# Patient Record
Sex: Female | Born: 1950 | Race: Black or African American | Hispanic: No | Marital: Single | Smoking: Current every day smoker
Health system: Southern US, Community
[De-identification: ages and names within clinical notes are randomized; demographics above are authoritative.]

## PROBLEM LIST (undated history)

## (undated) DIAGNOSIS — E119 Type 2 diabetes mellitus without complications: Secondary | ICD-10-CM

## (undated) DIAGNOSIS — I10 Essential (primary) hypertension: Secondary | ICD-10-CM

## (undated) DIAGNOSIS — K219 Gastro-esophageal reflux disease without esophagitis: Secondary | ICD-10-CM

## (undated) DIAGNOSIS — R609 Edema, unspecified: Secondary | ICD-10-CM

## (undated) DIAGNOSIS — E039 Hypothyroidism, unspecified: Secondary | ICD-10-CM

## (undated) DIAGNOSIS — R011 Cardiac murmur, unspecified: Secondary | ICD-10-CM

## (undated) HISTORY — PX: ABDOMINAL HYSTERECTOMY: SHX81

## (undated) HISTORY — PX: BIOPSY THYROID: PRO38

## (undated) HISTORY — PX: AMPUTATION: SHX166

---

## 2012-07-28 ENCOUNTER — Emergency Department: Payer: Self-pay | Admitting: Emergency Medicine

## 2012-07-28 LAB — URINALYSIS, COMPLETE
Bacteria: NONE SEEN
Bilirubin,UR: NEGATIVE
Glucose,UR: >=500 mg/dL (ref 0–75)
Ph: 6 (ref 4.5–8.0)
RBC,UR: 3 /HPF (ref 0–5)
Squamous Epithelial: 6
WBC UR: 1 /HPF (ref 0–5)

## 2016-01-27 ENCOUNTER — Encounter: Payer: Self-pay | Admitting: Emergency Medicine

## 2016-01-27 ENCOUNTER — Emergency Department: Payer: BLUE CROSS/BLUE SHIELD

## 2016-01-27 ENCOUNTER — Emergency Department
Admission: EM | Admit: 2016-01-27 | Discharge: 2016-01-27 | Disposition: A | Discharge status: Home / Self Care | Payer: BLUE CROSS/BLUE SHIELD | Attending: Emergency Medicine | Admitting: Emergency Medicine

## 2016-01-27 DIAGNOSIS — Y999 Unspecified external cause status: Secondary | ICD-10-CM | POA: Diagnosis not present

## 2016-01-27 DIAGNOSIS — W010XXA Fall on same level from slipping, tripping and stumbling without subsequent striking against object, initial encounter: Secondary | ICD-10-CM | POA: Insufficient documentation

## 2016-01-27 DIAGNOSIS — F17210 Nicotine dependence, cigarettes, uncomplicated: Secondary | ICD-10-CM | POA: Insufficient documentation

## 2016-01-27 DIAGNOSIS — Y9301 Activity, walking, marching and hiking: Secondary | ICD-10-CM | POA: Insufficient documentation

## 2016-01-27 DIAGNOSIS — Y929 Unspecified place or not applicable: Secondary | ICD-10-CM | POA: Diagnosis not present

## 2016-01-27 DIAGNOSIS — S56912A Strain of unspecified muscles, fascia and tendons at forearm level, left arm, initial encounter: Secondary | ICD-10-CM

## 2016-01-27 DIAGNOSIS — S56812A Strain of other muscles, fascia and tendons at forearm level, left arm, initial encounter: Secondary | ICD-10-CM | POA: Insufficient documentation

## 2016-01-27 DIAGNOSIS — S6992XA Unspecified injury of left wrist, hand and finger(s), initial encounter: Secondary | ICD-10-CM | POA: Diagnosis present

## 2016-01-27 MED ORDER — ACETAMINOPHEN-CODEINE #3 300-30 MG PO TABS: 1.0000 | ORAL_TABLET | ORAL | Status: DC | PRN

## 2016-01-27 NOTE — ED Provider Notes (Signed)
CSN: [id number]     Arrival date & time 01/27/16  1934 History   First MD Initiated Contact with Patient 01/27/16 2041     Chief Complaint  Patient presents with  . Fall     HPI  65 year old female who had a mechanical, non-syncopal fall earlier today. She states that she tripped over her mother's flowerpot and landed on the concrete. She states that she landed on her right forearm. She states that she has some minor abrasions to bilateral knees and her left wrist is a little sore but mainly the pain is in the right forearm from the wrist to the elbow. She took Tylenol earlier at about 4:00 with some relief. She denies striking her head or loss of consciousness. She does take a daily 81 mg aspirin.  No past medical history on file. No past surgical history on file. No family history on file. Social History  Substance Use Topics  . Smoking status: Heavy Tobacco Smoker -- 0.50 packs/day    Types: Cigarettes  . Smokeless tobacco: Not on file  . Alcohol Use: No   OB History    No data available     Review of Systems  Constitutional: Negative.   HENT: Negative.   Respiratory: Negative.   Musculoskeletal: Positive for myalgias. Negative for back pain, joint swelling, gait problem and neck pain.  Neurological: Negative for dizziness and headaches.  Psychiatric/Behavioral: Negative for confusion.      Allergies  Crestor  Home Medications   Prior to Admission medications   Medication Sig Start Date End Date Taking? Authorizing Provider  acetaminophen-codeine (TYLENOL #3) 300-30 MG tablet Take 1-2 tablets by mouth every 4 (four) hours as needed for moderate pain. 01/27/16   Mandalyn Pasqua B Jakwan Sally, FNP   BP 185/89 mmHg  Pulse 84  Temp(Src) 98.5 F (36.9 C) (Oral)  Ht 5\' 7"  (1.702 m)  Wt 72.576 kg  BMI 25.05 kg/m2  SpO2 96% Physical Exam  Constitutional: She is oriented to person, place, and time. She appears well-developed and well-nourished.  HENT:  Head: Normocephalic.  Eyes:  EOM are normal.  Neck: Normal range of motion.  Pulmonary/Chest: Effort normal.  Musculoskeletal: She exhibits tenderness.       Arms:      Legs: Neurological: She is alert and oriented to person, place, and time. No cranial nerve deficit. Coordination normal.  Skin: Skin is warm and dry.  Psychiatric: Her behavior is normal. Judgment and thought content normal.  Nursing note and vitals reviewed.   ED Course  Procedures (including critical care time) Labs Review Labs Reviewed - No data to display  Imaging Review Dg Forearm Left  01/27/2016  CLINICAL DATA:  Pain following fall EXAM: LEFT FOREARM - 2 VIEW COMPARISON:  None. FINDINGS: Frontal and lateral views were obtained. There is no fracture or dislocation. The joint spaces appear normal. There is a small spur arising from the olecranon process of the proximal ulna. No elbow joint effusion. IMPRESSION: Small proximal ulnar olecranon process spur. No fracture or dislocation. Electronically Signed   By: Lowella Grip III M.D.   On: 01/27/2016 19:53   I have personally reviewed and evaluated these images and lab results as part of my medical decision-making.   EKG Interpretation None      MDM   Final diagnoses:  Forearm strain, left, initial encounter    Left arm was placed in a sling after x-ray results were discussed with patient. She was instructed to wear the sling for  comfort and remove when she is at rest. She was encouraged to follow up with orthopedic provider on-call for symptoms that are not improving over the week. She was instructed to return to the emergency department for symptoms that change or worsen if she is unable schedule an appointment.    Victorino Dike, FNP 01/27/16 [place], MD 01/27/16 2237

## 2016-01-27 NOTE — Discharge Instructions (Signed)
Muscle Strain °A muscle strain (pulled muscle) happens when a muscle is stretched beyond normal length. It happens when a sudden, violent force stretches your muscle too far. Usually, a few of the fibers in your muscle are torn. Muscle strain is common in athletes. Recovery usually takes 1-2 weeks. Complete healing takes 5-6 weeks.  °HOME CARE  °· Follow the PRICE method of treatment to help your injury get better. Do this the first 2-3 days after the injury: °¨ Protect. Protect the muscle to keep it from getting injured again. °¨ Rest. Limit your activity and rest the injured body part. °¨ Ice. Put ice in a plastic bag. Place a towel between your skin and the bag. Then, apply the ice and leave it on from 15-20 minutes each hour. After the third day, switch to moist heat packs. °¨ Compression. Use a splint or elastic bandage on the injured area for comfort. Do not put it on too tightly. °¨ Elevate. Keep the injured body part above the level of your heart. °· Only take medicine as told by your doctor. °· Warm up before doing exercise to prevent future muscle strains. °GET HELP IF:  °· You have more pain or puffiness (swelling) in the injured area. °· You feel numbness, tingling, or notice a loss of strength in the injured area. °MAKE SURE YOU:  °· Understand these instructions. °· Will watch your condition. °· Will get help right away if you are not doing well or get worse. °  °This information is not intended to replace advice given to you by your health care provider. Make sure you discuss any questions you have with your health care provider. °  °Document Released: 07/21/2008 Document Revised: 08/02/2013 Document Reviewed: 05/11/2013 °Elsevier Interactive Patient Education ©2016 [employer] ° °

## 2016-01-27 NOTE — ED Notes (Signed)
Pt ambulatory to triage s/p fall.  Pt  major c/o of weakness and pain with movement to left elbow, pt dressed scratches to left knee and bruised to sub right knee.   Pt reports fall onto concrete from walking backwards position. Pt denies head injury or LOC and can't remember last tetanus shot.  Pt took 2 x 350 tylenol at 1600.  Pt takes daily low dose ASA.

## 2016-02-12 ENCOUNTER — Other Ambulatory Visit: Payer: Self-pay | Admitting: Internal Medicine

## 2016-02-12 DIAGNOSIS — Z1239 Encounter for other screening for malignant neoplasm of breast: Secondary | ICD-10-CM

## 2016-03-02 ENCOUNTER — Ambulatory Visit
Admission: RE | Admit: 2016-03-02 | Discharge: 2016-03-02 | Disposition: A | Discharge status: Home / Self Care | Payer: BLUE CROSS/BLUE SHIELD | Source: Ambulatory Visit | Attending: Internal Medicine | Admitting: Internal Medicine

## 2016-03-02 DIAGNOSIS — Z1231 Encounter for screening mammogram for malignant neoplasm of breast: Secondary | ICD-10-CM | POA: Diagnosis present

## 2016-03-02 DIAGNOSIS — Z1239 Encounter for other screening for malignant neoplasm of breast: Secondary | ICD-10-CM

## 2016-09-03 DIAGNOSIS — R7989 Other specified abnormal findings of blood chemistry: Secondary | ICD-10-CM | POA: Diagnosis not present

## 2016-09-03 DIAGNOSIS — E7800 Pure hypercholesterolemia, unspecified: Secondary | ICD-10-CM | POA: Diagnosis not present

## 2016-09-03 DIAGNOSIS — E119 Type 2 diabetes mellitus without complications: Secondary | ICD-10-CM | POA: Diagnosis not present

## 2016-09-03 DIAGNOSIS — I10 Essential (primary) hypertension: Secondary | ICD-10-CM | POA: Diagnosis not present

## 2016-09-07 DIAGNOSIS — K219 Gastro-esophageal reflux disease without esophagitis: Secondary | ICD-10-CM | POA: Diagnosis not present

## 2016-09-07 DIAGNOSIS — H9313 Tinnitus, bilateral: Secondary | ICD-10-CM | POA: Diagnosis not present

## 2016-09-07 DIAGNOSIS — H6063 Unspecified chronic otitis externa, bilateral: Secondary | ICD-10-CM | POA: Diagnosis not present

## 2016-09-07 DIAGNOSIS — E7800 Pure hypercholesterolemia, unspecified: Secondary | ICD-10-CM | POA: Diagnosis not present

## 2016-09-07 DIAGNOSIS — R946 Abnormal results of thyroid function studies: Secondary | ICD-10-CM | POA: Diagnosis not present

## 2016-09-07 DIAGNOSIS — I10 Essential (primary) hypertension: Secondary | ICD-10-CM | POA: Diagnosis not present

## 2016-09-07 DIAGNOSIS — E119 Type 2 diabetes mellitus without complications: Secondary | ICD-10-CM | POA: Diagnosis not present

## 2016-12-24 DIAGNOSIS — E119 Type 2 diabetes mellitus without complications: Secondary | ICD-10-CM | POA: Diagnosis not present

## 2017-01-01 DIAGNOSIS — K219 Gastro-esophageal reflux disease without esophagitis: Secondary | ICD-10-CM | POA: Diagnosis not present

## 2017-01-01 DIAGNOSIS — I10 Essential (primary) hypertension: Secondary | ICD-10-CM | POA: Diagnosis not present

## 2017-01-01 DIAGNOSIS — H6063 Unspecified chronic otitis externa, bilateral: Secondary | ICD-10-CM | POA: Diagnosis not present

## 2017-01-01 DIAGNOSIS — E119 Type 2 diabetes mellitus without complications: Secondary | ICD-10-CM | POA: Diagnosis not present

## 2017-01-01 DIAGNOSIS — H9313 Tinnitus, bilateral: Secondary | ICD-10-CM | POA: Diagnosis not present

## 2017-01-01 DIAGNOSIS — R946 Abnormal results of thyroid function studies: Secondary | ICD-10-CM | POA: Diagnosis not present

## 2017-01-01 DIAGNOSIS — E7800 Pure hypercholesterolemia, unspecified: Secondary | ICD-10-CM | POA: Diagnosis not present

## 2017-02-03 DIAGNOSIS — I10 Essential (primary) hypertension: Secondary | ICD-10-CM | POA: Diagnosis not present

## 2017-02-03 DIAGNOSIS — Z0000 Encounter for general adult medical examination without abnormal findings: Secondary | ICD-10-CM | POA: Diagnosis not present

## 2017-02-03 DIAGNOSIS — R7989 Other specified abnormal findings of blood chemistry: Secondary | ICD-10-CM | POA: Diagnosis not present

## 2017-02-03 DIAGNOSIS — E7800 Pure hypercholesterolemia, unspecified: Secondary | ICD-10-CM | POA: Diagnosis not present

## 2017-02-03 DIAGNOSIS — Z720 Tobacco use: Secondary | ICD-10-CM | POA: Diagnosis not present

## 2017-02-03 DIAGNOSIS — E119 Type 2 diabetes mellitus without complications: Secondary | ICD-10-CM | POA: Diagnosis not present

## 2017-02-03 DIAGNOSIS — K219 Gastro-esophageal reflux disease without esophagitis: Secondary | ICD-10-CM | POA: Diagnosis not present

## 2017-02-08 DIAGNOSIS — H2511 Age-related nuclear cataract, right eye: Secondary | ICD-10-CM | POA: Diagnosis not present

## 2017-02-11 DIAGNOSIS — H2511 Age-related nuclear cataract, right eye: Secondary | ICD-10-CM | POA: Diagnosis not present

## 2017-02-15 ENCOUNTER — Encounter: Payer: Self-pay | Admitting: *Deleted

## 2017-02-16 ENCOUNTER — Ambulatory Visit: Payer: Medicare HMO | Admitting: Anesthesiology

## 2017-02-16 ENCOUNTER — Encounter: Payer: Self-pay | Admitting: *Deleted

## 2017-02-16 ENCOUNTER — Ambulatory Visit
Admission: RE | Admit: 2017-02-16 | Discharge: 2017-02-16 | Disposition: A | Discharge status: Home / Self Care | Payer: Medicare HMO | Source: Ambulatory Visit | Attending: Ophthalmology | Admitting: Ophthalmology

## 2017-02-16 ENCOUNTER — Encounter
Admission: RE | Disposition: A | Discharge status: Home / Self Care | Payer: Self-pay | Source: Ambulatory Visit | Attending: Ophthalmology

## 2017-02-16 DIAGNOSIS — K219 Gastro-esophageal reflux disease without esophagitis: Secondary | ICD-10-CM | POA: Insufficient documentation

## 2017-02-16 DIAGNOSIS — E119 Type 2 diabetes mellitus without complications: Secondary | ICD-10-CM | POA: Diagnosis not present

## 2017-02-16 DIAGNOSIS — E1136 Type 2 diabetes mellitus with diabetic cataract: Secondary | ICD-10-CM | POA: Insufficient documentation

## 2017-02-16 DIAGNOSIS — Z955 Presence of coronary angioplasty implant and graft: Secondary | ICD-10-CM | POA: Insufficient documentation

## 2017-02-16 DIAGNOSIS — I10 Essential (primary) hypertension: Secondary | ICD-10-CM | POA: Insufficient documentation

## 2017-02-16 DIAGNOSIS — E039 Hypothyroidism, unspecified: Secondary | ICD-10-CM | POA: Diagnosis not present

## 2017-02-16 DIAGNOSIS — H2511 Age-related nuclear cataract, right eye: Secondary | ICD-10-CM | POA: Diagnosis not present

## 2017-02-16 DIAGNOSIS — F17200 Nicotine dependence, unspecified, uncomplicated: Secondary | ICD-10-CM | POA: Diagnosis not present

## 2017-02-16 HISTORY — DX: Gastro-esophageal reflux disease without esophagitis: K21.9

## 2017-02-16 HISTORY — DX: Type 2 diabetes mellitus without complications: E11.9

## 2017-02-16 HISTORY — PX: CATARACT EXTRACTION W/PHACO: SHX586

## 2017-02-16 HISTORY — DX: Essential (primary) hypertension: I10

## 2017-02-16 HISTORY — DX: Hypothyroidism, unspecified: E03.9

## 2017-02-16 HISTORY — DX: Cardiac murmur, unspecified: R01.1

## 2017-02-16 HISTORY — DX: Edema, unspecified: R60.9

## 2017-02-16 LAB — GLUCOSE, CAPILLARY: Glucose-Capillary: 364 mg/dL — ABNORMAL HIGH (ref 65–99)

## 2017-02-16 SURGERY — PHACOEMULSIFICATION, CATARACT, WITH IOL INSERTION
Anesthesia: Monitor Anesthesia Care | Site: Eye | Laterality: Right | Wound class: Clean

## 2017-02-16 MED ORDER — FENTANYL CITRATE (PF) 100 MCG/2ML IJ SOLN
INTRAMUSCULAR | Status: DC | PRN
  Administered 2017-02-16: 25 ug via INTRAVENOUS

## 2017-02-16 MED ORDER — LACTATED RINGERS IV SOLN
INTRAVENOUS | Status: DC | PRN
  Administered 2017-02-16: 08:00:00 via INTRAVENOUS

## 2017-02-16 MED ORDER — FENTANYL CITRATE (PF) 100 MCG/2ML IJ SOLN
INTRAMUSCULAR | Status: AC
  Filled 2017-02-16: qty 2

## 2017-02-16 MED ORDER — EPINEPHRINE PF 1 MG/ML IJ SOLN
INTRAOCULAR | Status: DC | PRN
  Administered 2017-02-16: 08:00:00 via OPHTHALMIC

## 2017-02-16 MED ORDER — LIDOCAINE HCL (PF) 2 % IJ SOLN
INTRAMUSCULAR | Status: AC
  Filled 2017-02-16: qty 2

## 2017-02-16 MED ORDER — SODIUM CHLORIDE 0.9 % IV SOLN
INTRAVENOUS | Status: DC
  Administered 2017-02-16: 07:00:00 via INTRAVENOUS

## 2017-02-16 MED ORDER — MOXIFLOXACIN HCL 0.5 % OP SOLN
OPHTHALMIC | Status: DC | PRN
  Administered 2017-02-16: 0.2 mL via OPHTHALMIC

## 2017-02-16 MED ORDER — NA CHONDROIT SULF-NA HYALURON 40-17 MG/ML IO SOLN
INTRAOCULAR | Status: AC
  Filled 2017-02-16: qty 1

## 2017-02-16 MED ORDER — MOXIFLOXACIN HCL 0.5 % OP SOLN
OPHTHALMIC | Status: AC
  Filled 2017-02-16: qty 3

## 2017-02-16 MED ORDER — POVIDONE-IODINE 5 % OP SOLN
OPHTHALMIC | Status: AC
  Filled 2017-02-16: qty 30

## 2017-02-16 MED ORDER — ARMC OPHTHALMIC DILATING DROPS
OPHTHALMIC | Status: AC
  Administered 2017-02-16: 1 via OPHTHALMIC
  Filled 2017-02-16: qty 0.4

## 2017-02-16 MED ORDER — POVIDONE-IODINE 5 % OP SOLN
OPHTHALMIC | Status: DC | PRN
  Administered 2017-02-16: 1 via OPHTHALMIC

## 2017-02-16 MED ORDER — NA CHONDROIT SULF-NA HYALURON 40-17 MG/ML IO SOLN
INTRAOCULAR | Status: DC | PRN
Start: 2017-02-16 — End: 2017-02-16
  Administered 2017-02-16: 1 mL via INTRAOCULAR

## 2017-02-16 MED ORDER — ARMC OPHTHALMIC DILATING DROPS
1.0000 "application " | OPHTHALMIC | Status: AC
  Administered 2017-02-16 (×2): 1 via OPHTHALMIC

## 2017-02-16 MED ORDER — MOXIFLOXACIN HCL 0.5 % OP SOLN: 1.0000 [drp] | OPHTHALMIC | Status: DC | PRN

## 2017-02-16 MED ORDER — MIDAZOLAM HCL 2 MG/2ML IJ SOLN
INTRAMUSCULAR | Status: AC
  Filled 2017-02-16: qty 2

## 2017-02-16 MED ORDER — EPINEPHRINE PF 1 MG/ML IJ SOLN
INTRAMUSCULAR | Status: AC
  Filled 2017-02-16: qty 2

## 2017-02-16 MED ORDER — MIDAZOLAM HCL 5 MG/5ML IJ SOLN
INTRAMUSCULAR | Status: DC | PRN
  Administered 2017-02-16: 1 mg via INTRAVENOUS

## 2017-02-16 MED ORDER — ARMC OPHTHALMIC DILATING DROPS: 1.0000 "application " | OPHTHALMIC | Status: AC

## 2017-02-16 MED ORDER — LIDOCAINE HCL (PF) 4 % IJ SOLN
INTRAOCULAR | Status: DC | PRN
  Administered 2017-02-16: 4 mL via OPHTHALMIC

## 2017-02-16 SURGICAL SUPPLY — 21 items
CANNULA ANT/CHMB 27GA (MISCELLANEOUS) ×3 IMPLANT
CUP MEDICINE 2OZ PLAST GRAD ST (MISCELLANEOUS) ×3 IMPLANT
GLOVE BIO SURGEON STRL SZ8 (GLOVE) ×3 IMPLANT
GLOVE BIOGEL M 6.5 STRL (GLOVE) ×3 IMPLANT
GLOVE SURG LX 8.0 MICRO (GLOVE) ×2
GLOVE SURG LX STRL 8.0 MICRO (GLOVE) ×1 IMPLANT
GOWN STRL REUS W/ TWL LRG LVL3 (GOWN DISPOSABLE) ×2 IMPLANT
GOWN STRL REUS W/TWL LRG LVL3 (GOWN DISPOSABLE) ×4
LENS IOL TECNIS ITEC 22.0 (Intraocular Lens) ×3 IMPLANT
PACK CATARACT (MISCELLANEOUS) ×3 IMPLANT
PACK CATARACT BRASINGTON LX (MISCELLANEOUS) ×3 IMPLANT
PACK EYE AFTER SURG (MISCELLANEOUS) ×3 IMPLANT
SOL BSS BAG (MISCELLANEOUS) ×3
SOL PREP PVP 2OZ (MISCELLANEOUS) ×3
SOLUTION BSS BAG (MISCELLANEOUS) ×1 IMPLANT
SOLUTION PREP PVP 2OZ (MISCELLANEOUS) ×1 IMPLANT
SYR 3ML LL SCALE MARK (SYRINGE) ×3 IMPLANT
SYR 5ML LL (SYRINGE) ×3 IMPLANT
SYR TB 1ML 27GX1/2 LL (SYRINGE) ×3 IMPLANT
WATER STERILE IRR 250ML POUR (IV SOLUTION) ×3 IMPLANT
WIPE NON LINTING 3.25X3.25 (MISCELLANEOUS) ×3 IMPLANT

## 2017-02-16 NOTE — Discharge Instructions (Signed)
Eye Surgery Discharge Instructions  Expect mild scratchy sensation or mild soreness. DO NOT RUB YOUR EYE!  The day of surgery:  Minimal physical activity, but bed rest is not required  No reading, computer work, or close hand work  No bending, lifting, or straining.  May watch TV  For 24 hours:  No driving, legal decisions, or alcoholic beverages  Safety precautions  Eat anything you prefer: It is better to start with liquids, then soup then solid foods.  _____ Eye patch should be worn until postoperative exam tomorrow.  ____ Solar shield eyeglasses should be worn for comfort in the sunlight/patch while sleeping  Resume all regular medications including aspirin or Coumadin if these were discontinued prior to surgery. You may shower, bathe, shave, or wash your hair. Tylenol may be taken for mild discomfort.  Call your doctor if you experience significant pain, nausea, or vomiting, fever > 101 or other signs of infection. [phone] or [phone] Specific instructions:  Follow-up Information    PORFILIO,WILLIAM [name], MD Follow up.   Specialty:  Ophthalmology Why:  02/17/17 at 8:50 Contact information: [address] [place] [address] [phone]          Eye Surgery Discharge Instructions  Expect mild scratchy sensation or mild soreness. DO NOT RUB YOUR EYE!  The day of surgery:  Minimal physical activity, but bed rest is not required  No reading, computer work, or close hand work  No bending, lifting, or straining.  May watch TV  For 24 hours:  No driving, legal decisions, or alcoholic beverages  Safety precautions  Eat anything you prefer: It is better to start with liquids, then soup then solid foods.  _____ Eye patch should be worn until postoperative exam tomorrow.  ____ Solar shield eyeglasses should be worn for comfort in the sunlight/patch while sleeping  Resume all regular medications including aspirin or Coumadin if these  were discontinued prior to surgery. You may shower, bathe, shave, or wash your hair. Tylenol may be taken for mild discomfort.  Call your doctor if you experience significant pain, nausea, or vomiting, fever > 101 or other signs of infection. [phone] or [phone] Specific instructions:  Follow-up Information    PORFILIO,WILLIAM [name], MD Follow up.   Specialty:  Ophthalmology Why:  02/17/17 at 8:50 Contact information: [place] [address] [phone]

## 2017-02-16 NOTE — H&P (Signed)
All labs reviewed. Abnormal studies sent to patients PCP when indicated.  Previous H&P reviewed, patient examined, there are NO CHANGES.  Sara Pittman [name]4/24/20187:49 AM

## 2017-02-16 NOTE — Anesthesia Postprocedure Evaluation (Signed)
Anesthesia Post Note  Patient: Sara Pittman  Procedure(s) Performed: Procedure(s) (LRB): CATARACT EXTRACTION PHACO AND INTRAOCULAR LENS PLACEMENT (IOC) (Right)  Patient location during evaluation: PACU Anesthesia Type: MAC Level of consciousness: awake and alert Pain management: pain level controlled Vital Signs Assessment: post-procedure vital signs reviewed and stable Respiratory status: spontaneous breathing, nonlabored ventilation, respiratory function stable and patient connected to nasal cannula oxygen Cardiovascular status: stable and blood pressure returned to baseline Anesthetic complications: no     Last Vitals:  Vitals:   02/16/17 0818 02/16/17 0829  BP: (!) 158/79 (!) 161/80  Pulse: 65 61  Resp: 16   Temp: (!) 35.9 C     Last Pain:  Vitals:   02/16/17 0818  TempSrc: Temporal                 Martha Clan

## 2017-02-16 NOTE — Anesthesia Post-op Follow-up Note (Cosign Needed)
Anesthesia QCDR form completed.        

## 2017-02-16 NOTE — Op Note (Signed)
PREOPERATIVE DIAGNOSIS:  Nuclear sclerotic cataract of the right eye.   POSTOPERATIVE DIAGNOSIS:  NUCLEAR SCLEROTIC CATARACT RIGHT EYE   OPERATIVE PROCEDURE: Procedure(s): CATARACT EXTRACTION PHACO AND INTRAOCULAR LENS PLACEMENT (IOC)   SURGEON:  Birder Robson, MD.   ANESTHESIA:  Anesthesiologist: Martha Clan, MD CRNA: Jennette Bill  1.      Managed anesthesia care. 2.      0.44ml of Shugarcaine was instilled in the eye following the paracentesis.   COMPLICATIONS:  None.   TECHNIQUE:   Stop and chop   DESCRIPTION OF PROCEDURE:  The patient was examined and consented in the preoperative holding area where the aforementioned topical anesthesia was applied to the right eye and then brought back to the Operating Room where the right eye was prepped and draped in the usual sterile ophthalmic fashion and a lid speculum was placed. A paracentesis was created with the side port blade and the anterior chamber was filled with viscoelastic. A near clear corneal incision was performed with the steel keratome. A continuous curvilinear capsulorrhexis was performed with a cystotome followed by the capsulorrhexis forceps. Hydrodissection and hydrodelineation were carried out with BSS on a blunt cannula. The lens was removed in a stop and chop  technique and the remaining cortical material was removed with the irrigation-aspiration handpiece. The capsular bag was inflated with viscoelastic and the Technis ZCB00  lens was placed in the capsular bag without complication. The remaining viscoelastic was removed from the eye with the irrigation-aspiration handpiece. The wounds were hydrated. The anterior chamber was flushed with Miostat and the eye was inflated to physiologic pressure. 0.30ml of Vigamox was placed in the anterior chamber. The wounds were found to be water tight. The eye was dressed with Vigamox. The patient was given protective glasses to wear throughout the day and a shield with which to sleep  tonight. The patient was also given drops with which to begin a drop regimen today and will follow-up with me in one day.  Implant Name Type Inv. Item Serial No. Manufacturer Lot No. LRB No. Used  LENS IOL DIOP 22.0 - [id number] Intraocular Lens LENS IOL DIOP 22.0 [id number]   Right 1   Procedure(s) with comments: CATARACT EXTRACTION PHACO AND INTRAOCULAR LENS PLACEMENT (IOC) (Right) - [place] 52.5 AP% 17.7 CDE 9.28 FLUID PACK LOT # [id number]  Electronically signed: [place] 02/16/2017 8:18 AM

## 2017-02-16 NOTE — Transfer of Care (Signed)
Immediate Anesthesia Transfer of Care Note  Patient: Sara Pittman  Procedure(s) Performed: Procedure(s) with comments: CATARACT EXTRACTION PHACO AND INTRAOCULAR LENS PLACEMENT (IOC) (Right) - [place] 52.5 AP% 17.7 CDE 9.28 FLUID PACK LOT # [id number]  Patient Location: PACU  Anesthesia Type:MAC  Level of Consciousness: awake, alert  and oriented  Airway & Oxygen Therapy: Patient Spontanous Breathing and Patient connected to nasal cannula oxygen  Post-op Assessment: Report given to RN and Post -op Vital signs reviewed and stable  Post vital signs: Reviewed and stable  Last Vitals:  Vitals:   02/16/17 0643 02/16/17 0818  BP: (!) 146/82 (!) 158/79  Pulse: 64 65  Resp: 16 16  Temp: (!) 35.9 C (!) 35.9 C    Last Pain:  Vitals:   02/16/17 0818  TempSrc: Temporal         Complications: No apparent anesthesia complications

## 2017-02-16 NOTE — Anesthesia Preprocedure Evaluation (Signed)
Anesthesia Evaluation  Patient identified by MRN, date of birth, ID band Patient awake    Reviewed: Allergy & Precautions, H&P , NPO status , Patient's Chart, lab work & pertinent test results, reviewed documented beta blocker date and time   History of Anesthesia Complications Negative for: history of anesthetic complications  Airway Mallampati: III  TM Distance: >3 FB Neck ROM: full    Dental  (+) Upper Dentures, Partial Lower, Poor Dentition   Pulmonary neg shortness of breath, neg sleep apnea, neg COPD, neg recent URI, Current Smoker,           Cardiovascular Exercise Tolerance: Good hypertension, (-) angina(-) CAD, (-) Past MI, (-) Cardiac Stents and (-) CABG (-) dysrhythmias + Valvular Problems/Murmurs      Neuro/Psych negative neurological ROS  negative psych ROS   GI/Hepatic Neg liver ROS, GERD  ,  Endo/Other  diabetesHypothyroidism   Renal/GU negative Renal ROS  negative genitourinary   Musculoskeletal   Abdominal   Peds  Hematology negative hematology ROS (+)   Anesthesia Other Findings Past Medical History: No date: Diabetes mellitus without complication (HCC) No date: Edema     Comment: feet/leg No date: GERD (gastroesophageal reflux disease) No date: Heart murmur No date: Hypertension No date: Hypothyroidism   Reproductive/Obstetrics negative OB ROS                             Anesthesia Physical Anesthesia Plan  ASA: III  Anesthesia Plan: MAC   Post-op Pain Management:    Induction:   Airway Management Planned:   Additional Equipment:   Intra-op Plan:   Post-operative Plan:   Informed Consent: I have reviewed the patients History and Physical, chart, labs and discussed the procedure including the risks, benefits and alternatives for the proposed anesthesia with the patient or authorized representative who has indicated his/her understanding and acceptance.    Dental Advisory Given  Plan Discussed with: Anesthesiologist, CRNA and Surgeon  Anesthesia Plan Comments:         Anesthesia Quick Evaluation

## 2017-03-02 DIAGNOSIS — H2512 Age-related nuclear cataract, left eye: Secondary | ICD-10-CM | POA: Diagnosis not present

## 2017-03-03 ENCOUNTER — Encounter: Payer: Self-pay | Admitting: *Deleted

## 2017-03-09 ENCOUNTER — Encounter
Admission: RE | Disposition: A | Discharge status: Home / Self Care | Payer: Self-pay | Source: Ambulatory Visit | Attending: Ophthalmology

## 2017-03-09 ENCOUNTER — Ambulatory Visit: Payer: Medicare HMO | Admitting: Certified Registered Nurse Anesthetist

## 2017-03-09 ENCOUNTER — Encounter: Payer: Self-pay | Admitting: *Deleted

## 2017-03-09 ENCOUNTER — Ambulatory Visit
Admission: RE | Admit: 2017-03-09 | Discharge: 2017-03-09 | Disposition: A | Discharge status: Home / Self Care | Payer: Medicare HMO | Source: Ambulatory Visit | Attending: Ophthalmology | Admitting: Ophthalmology

## 2017-03-09 DIAGNOSIS — K219 Gastro-esophageal reflux disease without esophagitis: Secondary | ICD-10-CM | POA: Insufficient documentation

## 2017-03-09 DIAGNOSIS — Z955 Presence of coronary angioplasty implant and graft: Secondary | ICD-10-CM | POA: Diagnosis not present

## 2017-03-09 DIAGNOSIS — I10 Essential (primary) hypertension: Secondary | ICD-10-CM | POA: Diagnosis not present

## 2017-03-09 DIAGNOSIS — E1136 Type 2 diabetes mellitus with diabetic cataract: Secondary | ICD-10-CM | POA: Diagnosis not present

## 2017-03-09 DIAGNOSIS — E119 Type 2 diabetes mellitus without complications: Secondary | ICD-10-CM | POA: Diagnosis not present

## 2017-03-09 DIAGNOSIS — F17200 Nicotine dependence, unspecified, uncomplicated: Secondary | ICD-10-CM | POA: Insufficient documentation

## 2017-03-09 DIAGNOSIS — E039 Hypothyroidism, unspecified: Secondary | ICD-10-CM | POA: Diagnosis not present

## 2017-03-09 DIAGNOSIS — H2512 Age-related nuclear cataract, left eye: Secondary | ICD-10-CM | POA: Diagnosis not present

## 2017-03-09 HISTORY — PX: CATARACT EXTRACTION W/PHACO: SHX586

## 2017-03-09 LAB — GLUCOSE, CAPILLARY: Glucose-Capillary: 224 mg/dL — ABNORMAL HIGH (ref 65–99)

## 2017-03-09 SURGERY — PHACOEMULSIFICATION, CATARACT, WITH IOL INSERTION
Anesthesia: Monitor Anesthesia Care | Site: Eye | Laterality: Left | Wound class: Clean

## 2017-03-09 MED ORDER — POVIDONE-IODINE 5 % OP SOLN
OPHTHALMIC | Status: DC | PRN
Start: 2017-03-09 — End: 2017-03-09
  Administered 2017-03-09: 1 via OPHTHALMIC

## 2017-03-09 MED ORDER — EPINEPHRINE PF 1 MG/ML IJ SOLN
INTRAMUSCULAR | Status: AC
  Filled 2017-03-09: qty 2

## 2017-03-09 MED ORDER — FENTANYL CITRATE (PF) 100 MCG/2ML IJ SOLN
INTRAMUSCULAR | Status: AC
  Filled 2017-03-09: qty 2

## 2017-03-09 MED ORDER — SODIUM CHLORIDE 0.9 % IV SOLN
INTRAVENOUS | Status: DC
  Administered 2017-03-09: 09:00:00 via INTRAVENOUS

## 2017-03-09 MED ORDER — NA CHONDROIT SULF-NA HYALURON 40-17 MG/ML IO SOLN
INTRAOCULAR | Status: DC | PRN
  Administered 2017-03-09: 1 mL via INTRAOCULAR

## 2017-03-09 MED ORDER — MOXIFLOXACIN HCL 0.5 % OP SOLN
OPHTHALMIC | Status: AC
  Filled 2017-03-09: qty 3

## 2017-03-09 MED ORDER — EPINEPHRINE PF 1 MG/ML IJ SOLN
INTRAMUSCULAR | Status: DC | PRN
  Administered 2017-03-09: 10:00:00 via OPHTHALMIC

## 2017-03-09 MED ORDER — NA CHONDROIT SULF-NA HYALURON 40-17 MG/ML IO SOLN
INTRAOCULAR | Status: AC
  Filled 2017-03-09: qty 1

## 2017-03-09 MED ORDER — POVIDONE-IODINE 5 % OP SOLN
OPHTHALMIC | Status: AC
  Filled 2017-03-09: qty 30

## 2017-03-09 MED ORDER — LIDOCAINE HCL (PF) 4 % IJ SOLN
INTRAOCULAR | Status: DC | PRN
  Administered 2017-03-09: 4 mL via OPHTHALMIC

## 2017-03-09 MED ORDER — MOXIFLOXACIN HCL 0.5 % OP SOLN: 1.0000 [drp] | OPHTHALMIC | Status: DC | PRN

## 2017-03-09 MED ORDER — FENTANYL CITRATE (PF) 100 MCG/2ML IJ SOLN
INTRAMUSCULAR | Status: DC | PRN
  Administered 2017-03-09: 25 ug via INTRAVENOUS
  Administered 2017-03-09: 75 ug via INTRAVENOUS

## 2017-03-09 MED ORDER — LIDOCAINE HCL (PF) 2 % IJ SOLN
INTRAMUSCULAR | Status: AC
  Filled 2017-03-09: qty 2

## 2017-03-09 MED ORDER — CARBACHOL 0.01 % IO SOLN
INTRAOCULAR | Status: DC | PRN
  Administered 2017-03-09: 0.5 mL via INTRAOCULAR

## 2017-03-09 MED ORDER — MOXIFLOXACIN HCL 0.5 % OP SOLN
OPHTHALMIC | Status: DC | PRN
  Administered 2017-03-09: 0.2 mL via OPHTHALMIC

## 2017-03-09 MED ORDER — ARMC OPHTHALMIC DILATING DROPS
OPHTHALMIC | Status: AC
  Filled 2017-03-09: qty 0.4

## 2017-03-09 MED ORDER — ARMC OPHTHALMIC DILATING DROPS
1.0000 "application " | OPHTHALMIC | Status: AC
  Administered 2017-03-09 (×3): 1 via OPHTHALMIC

## 2017-03-09 SURGICAL SUPPLY — 14 items
GLOVE BIO SURGEON STRL SZ8 (GLOVE) ×3 IMPLANT
GLOVE BIOGEL M 6.5 STRL (GLOVE) ×3 IMPLANT
GLOVE SURG LX 8.0 MICRO (GLOVE) ×2
GLOVE SURG LX STRL 8.0 MICRO (GLOVE) ×1 IMPLANT
GOWN STRL REUS W/ TWL LRG LVL3 (GOWN DISPOSABLE) ×2 IMPLANT
GOWN STRL REUS W/TWL LRG LVL3 (GOWN DISPOSABLE) ×4
LENS IOL TECNIS ITEC 22.0 (Intraocular Lens) ×3 IMPLANT
PACK CATARACT (MISCELLANEOUS) ×3 IMPLANT
PACK CATARACT BRASINGTON LX (MISCELLANEOUS) ×3 IMPLANT
SOL BSS BAG (MISCELLANEOUS) ×3
SOLUTION BSS BAG (MISCELLANEOUS) ×1 IMPLANT
SYR 5ML LL (SYRINGE) ×3 IMPLANT
WATER STERILE IRR 250ML POUR (IV SOLUTION) ×3 IMPLANT
WIPE NON LINTING 3.25X3.25 (MISCELLANEOUS) ×3 IMPLANT

## 2017-03-09 NOTE — Op Note (Signed)
PREOPERATIVE DIAGNOSIS:  Nuclear sclerotic cataract of the left eye.   POSTOPERATIVE DIAGNOSIS:  Nuclear sclerotic cataract of the left eye.   OPERATIVE PROCEDURE: Procedure(s): CATARACT EXTRACTION PHACO AND INTRAOCULAR LENS PLACEMENT (IOC)   SURGEON:  Birder Robson, MD.   ANESTHESIA:  Anesthesiologist: Martha Clan, MD CRNA: Demetrius Charity, CRNA  1.      Managed anesthesia care. 2.     0.42ml of Shugarcaine was instilled following the paracentesis   COMPLICATIONS:  None.   TECHNIQUE:   Stop and chop   DESCRIPTION OF PROCEDURE:  The patient was examined and consented in the preoperative holding area where the aforementioned topical anesthesia was applied to the left eye and then brought back to the Operating Room where the left eye was prepped and draped in the usual sterile ophthalmic fashion and a lid speculum was placed. A paracentesis was created with the side port blade and the anterior chamber was filled with viscoelastic. A near clear corneal incision was performed with the steel keratome. A continuous curvilinear capsulorrhexis was performed with a cystotome followed by the capsulorrhexis forceps. Hydrodissection and hydrodelineation were carried out with BSS on a blunt cannula. The lens was removed in a stop and chop  technique and the remaining cortical material was removed with the irrigation-aspiration handpiece. The capsular bag was inflated with viscoelastic and the Technis ZCB00 lens was placed in the capsular bag without complication. The remaining viscoelastic was removed from the eye with the irrigation-aspiration handpiece. The wounds were hydrated. The anterior chamber was flushed with Miostat and the eye was inflated to physiologic pressure. 0.79ml Vigamox was placed in the anterior chamber. The wounds were found to be water tight. The eye was dressed with Vigamox. The patient was given protective glasses to wear throughout the day and a shield with which to sleep  tonight. The patient was also given drops with which to begin a drop regimen today and will follow-up with me in one day.  Implant Name Type Inv. Item Serial No. Manufacturer Lot No. LRB No. Used  LENS IOL DIOP 22.0 - [id number] Intraocular Lens LENS IOL DIOP 22.0 [id number] [id number] AMO   Left 1    Procedure(s) with comments: CATARACT EXTRACTION PHACO AND INTRAOCULAR LENS PLACEMENT (IOC) (Left) - [place] 1:02.2 AP% 19.8 CDE 12.30 Fluid Pack Lot # [id number]  Electronically signed: [place] 03/09/2017 9:59 AM

## 2017-03-09 NOTE — Discharge Instructions (Signed)
Eye Surgery Discharge Instructions  Expect mild scratchy sensation or mild soreness. DO NOT RUB YOUR EYE!  The day of surgery:  Minimal physical activity, but bed rest is not required  No reading, computer work, or close hand work  No bending, lifting, or straining.  May watch TV  For 24 hours:  No driving, legal decisions, or alcoholic beverages  Safety precautions  Eat anything you prefer: It is better to start with liquids, then soup then solid foods.  _____ Eye patch should be worn until postoperative exam tomorrow.  ____ Solar shield eyeglasses should be worn for comfort in the sunlight/patch while sleeping  Resume all regular medications including aspirin or Coumadin if these were discontinued prior to surgery. You may shower, bathe, shave, or wash your hair. Tylenol may be taken for mild discomfort.  Call your doctor if you experience significant pain, nausea, or vomiting, fever > 101 or other signs of infection. [phone] or [phone] Specific instructions:  Follow-up Information    Birder Robson, MD Follow up.   Specialty:  Ophthalmology Why:  May 16 at 10:55am Contact information: [address] [place] [address] [phone]

## 2017-03-09 NOTE — Anesthesia Post-op Follow-up Note (Cosign Needed)
Anesthesia QCDR form completed.        

## 2017-03-09 NOTE — Anesthesia Procedure Notes (Signed)
Procedure Name: MAC Performed by: Meighan Treto Pre-anesthesia Checklist: Patient identified, Emergency Drugs available, Suction available, Patient being monitored and Timeout performed Oxygen Delivery Method: Nasal cannula       

## 2017-03-09 NOTE — Anesthesia Preprocedure Evaluation (Signed)
Anesthesia Evaluation  Patient identified by MRN, date of birth, ID band Patient awake    Reviewed: Allergy & Precautions, H&P , NPO status , Patient's Chart, lab work & pertinent test results, reviewed documented beta blocker date and time   History of Anesthesia Complications Negative for: history of anesthetic complications  Airway Mallampati: III  TM Distance: >3 FB Neck ROM: full    Dental  (+) Upper Dentures, Partial Lower, Poor Dentition   Pulmonary neg shortness of breath, neg sleep apnea, neg COPD, neg recent URI, Current Smoker,           Cardiovascular Exercise Tolerance: Good hypertension, (-) angina(-) CAD, (-) Past MI, (-) Cardiac Stents and (-) CABG (-) dysrhythmias + Valvular Problems/Murmurs      Neuro/Psych negative neurological ROS  negative psych ROS   GI/Hepatic Neg liver ROS, GERD  ,  Endo/Other  diabetesHypothyroidism   Renal/GU negative Renal ROS  negative genitourinary   Musculoskeletal   Abdominal   Peds  Hematology negative hematology ROS (+)   Anesthesia Other Findings Past Medical History: No date: Diabetes mellitus without complication (HCC) No date: Edema     Comment: feet/leg No date: GERD (gastroesophageal reflux disease) No date: Heart murmur No date: Hypertension No date: Hypothyroidism   Reproductive/Obstetrics negative OB ROS                             Anesthesia Physical  Anesthesia Plan  ASA: III  Anesthesia Plan: MAC   Post-op Pain Management:    Induction:   Airway Management Planned:   Additional Equipment:   Intra-op Plan:   Post-operative Plan:   Informed Consent: I have reviewed the patients History and Physical, chart, labs and discussed the procedure including the risks, benefits and alternatives for the proposed anesthesia with the patient or authorized representative who has indicated his/her understanding and acceptance.    Dental Advisory Given  Plan Discussed with: Anesthesiologist, CRNA and Surgeon  Anesthesia Plan Comments:         Anesthesia Quick Evaluation

## 2017-03-09 NOTE — Anesthesia Postprocedure Evaluation (Signed)
Anesthesia Post Note  Patient: Sara Pittman  Procedure(s) Performed: Procedure(s) (LRB): CATARACT EXTRACTION PHACO AND INTRAOCULAR LENS PLACEMENT (IOC) (Left)  Patient location during evaluation: PACU Anesthesia Type: MAC Level of consciousness: awake and alert and oriented Pain management: satisfactory to patient Vital Signs Assessment: post-procedure vital signs reviewed and stable Respiratory status: respiratory function stable Cardiovascular status: blood pressure returned to baseline Anesthetic complications: no     Last Vitals:  Vitals:   03/09/17 0815  BP: (!) 179/81  Pulse: 68  Resp: 18  Temp: 36.8 C    Last Pain:  Vitals:   03/09/17 0815  TempSrc: Oral                 Blima Singer

## 2017-03-09 NOTE — H&P (Signed)
All labs reviewed. Abnormal studies sent to patients PCP when indicated.  Previous H&P reviewed, patient examined, there are NO CHANGES.  Sara Pittman [name]5/15/20189:32 AM

## 2017-03-09 NOTE — Transfer of Care (Signed)
Immediate Anesthesia Transfer of Care Note  Patient: Sara Pittman  Procedure(s) Performed: Procedure(s) with comments: CATARACT EXTRACTION PHACO AND INTRAOCULAR LENS PLACEMENT ([place]) (Left) - [place] 1:02.2 AP% 19.8 CDE 12.30 Fluid Pack Lot # [id number]  Patient Location: PACU  Anesthesia Type:MAC  Level of Consciousness: awake, alert  and oriented  Airway & Oxygen Therapy: Patient Spontanous Breathing  Post-op Assessment: Report given to RN and Post -op Vital signs reviewed and stable  Post vital signs: Reviewed and stable  Last Vitals:  Vitals:   03/09/17 0815  BP: (!) 179/81  Pulse: 68  Resp: 18  Temp: 36.8 C    Last Pain:  Vitals:   03/09/17 0815  TempSrc: Oral         Complications: No apparent anesthesia complications

## 2017-04-17 DIAGNOSIS — J019 Acute sinusitis, unspecified: Secondary | ICD-10-CM | POA: Diagnosis not present

## 2017-04-19 DIAGNOSIS — E113393 Type 2 diabetes mellitus with moderate nonproliferative diabetic retinopathy without macular edema, bilateral: Secondary | ICD-10-CM | POA: Diagnosis not present

## 2017-10-15 ENCOUNTER — Ambulatory Visit (INDEPENDENT_AMBULATORY_CARE_PROVIDER_SITE_OTHER): Payer: Medicare Other | Admitting: Urology

## 2017-10-15 ENCOUNTER — Encounter: Payer: Self-pay | Admitting: Urology

## 2017-10-15 VITALS — BP 158/77 | HR 82 | Ht 67.5 in | Wt 160.4 lb

## 2017-10-15 DIAGNOSIS — R3129 Other microscopic hematuria: Secondary | ICD-10-CM

## 2017-10-15 LAB — URINALYSIS, COMPLETE
Bilirubin, UA: NEGATIVE
Glucose, UA: NEGATIVE
Ketones, UA: NEGATIVE
LEUKOCYTES UA: NEGATIVE
NITRITE UA: NEGATIVE
PH UA: 5.5 (ref 5.0–7.5)
Specific Gravity, UA: <1.005 — ABNORMAL LOW (ref 1.005–1.030)
UUROB: 0.2 mg/dL (ref 0.2–1.0)

## 2017-10-15 NOTE — Progress Notes (Signed)
10/15/2017 2:39 PM   Sara Pittman 1951-06-28 [id number]  Referring provider: Katheren Shams [address] [place], [place] [address]  Chief Complaint  Patient presents with  . Hematuria    HPI: The patient is a 66 year old female who presents today for evaluation of microscopic hematuria this was seen on urinalysis her primary care providers were 4-10 red blood cells per high field were encountered.  Patient notes no history of gross hematuria.  No recent urinary tract infection.  No difficulties with urination.  No history of nephrolithiasis.  No current complaints.  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   PMH: Past Medical History:  Diagnosis Date  . Diabetes mellitus without complication ([place])   . Edema    feet/leg  . GERD (gastroesophageal reflux disease)   . Heart murmur   . Hypertension   . Hypothyroidism     Surgical History: Past Surgical History:  Procedure Laterality Date  . ABDOMINAL HYSTERECTOMY    . AMPUTATION     tip of finger  . BIOPSY THYROID    . CATARACT EXTRACTION W/PHACO Right 02/16/2017   Procedure: CATARACT EXTRACTION PHACO AND INTRAOCULAR LENS PLACEMENT (IOC);  Surgeon: Birder Robson, MD;  Location: [facility] OR[facility];  Service: Ophthalmology;  Laterality: Right;  [place] 52.5 AP% 17.7 CDE 9.28 FLUID PACK LOT # [id number]  . CATARACT EXTRACTION W/PHACO Left 03/09/2017   Procedure: CATARACT EXTRACTION PHACO AND INTRAOCULAR LENS PLACEMENT (IOC);  Surgeon: Birder Robson, MD;  Location: [facility] OR[facility];  Service: Ophthalmology;  Laterality: Left;  [place] 1:02.2 AP% 19.8 CDE 12.30 Fluid Pack Lot # [id number]    Home Medications:  Allergies as of 10/15/2017      Reactions   Crestor [rosuvastatin] Other (See Comments)   All over aches      Medication List        Accurate as of 10/15/17  2:39 PM. Always use your most recent med list.          aspirin EC 81 MG  tablet Take 81 mg by mouth daily.   BC HEADACHE POWDER PO Take 1 packet by mouth 3 (three) times daily as needed (headache).   cholecalciferol 1000 units tablet Commonly known as:  VITAMIN D Take 1,000 Units by mouth daily.   glipiZIDE 10 MG tablet Commonly known as:  GLUCOTROL Take 10 mg by mouth 2 (two) times daily before a meal.   losartan 100 MG tablet Commonly known as:  COZAAR Take 100 mg by mouth daily.   metFORMIN 1000 MG tablet Commonly known as:  GLUCOPHAGE Take 1,000 mg by mouth 2 (two) times daily with a meal.   moxifloxacin 0.5 % ophthalmic solution Commonly known as:  VIGAMOX Apply 1 drop to eye 4 (four) times daily.   neomycin-polymyxin-hydrocortisone 3.5-10000-1 OTIC suspension Commonly known as:  CORTISPORIN Place 3 drops into both ears 4 (four) times daily as needed (ringing in ears).   omeprazole 20 MG capsule Commonly known as:  PRILOSEC Take 20 mg by mouth daily.   simvastatin 80 MG tablet Commonly known as:  ZOCOR Take 80 mg by mouth at bedtime.   TRULICITY 1.02 HE/5.2DP Sopn Generic drug:  Dulaglutide Inject 0.75 mg into the skin every 7 (seven) days. sunday       Allergies:  Allergies  Allergen Reactions  . Crestor [Rosuvastatin] Other (See Comments)    All over aches     Family History:  Family History  Problem Relation Age of Onset  . Bladder Cancer Neg Hx   . Kidney cancer Neg Hx   . Prostate cancer Neg Hx     Social History:  reports that she has been smoking cigarettes.  She has been smoking about 0.50 packs per day. she has never used smokeless tobacco. She reports that she does not drink alcohol or use drugs.  ROS: UROLOGY Frequent Urination?: No Hard to postpone urination?: No Burning/pain with urination?: No Get up at night to urinate?: Yes Leakage of urine?: Yes Urine stream starts and stops?: No Trouble starting stream?: No Do you have to strain to urinate?: No Blood in urine?: No Urinary tract infection?:  No Sexually transmitted disease?: No Injury to kidneys or bladder?: No Painful intercourse?: No Weak stream?: No Currently pregnant?: No Vaginal bleeding?: No Last menstrual period?: n  Gastrointestinal Nausea?: Yes Vomiting?: No Indigestion/heartburn?: Yes Diarrhea?: No Constipation?: No  Constitutional Fever: No Night sweats?: Yes Weight loss?: No Fatigue?: No  Skin Skin rash/lesions?: No Itching?: No  Eyes Blurred vision?: No Double vision?: No  Ears/Nose/Throat Sore throat?: No Sinus problems?: No  Hematologic/Lymphatic Swollen glands?: No Easy bruising?: No  Cardiovascular Leg swelling?: No Chest pain?: No  Respiratory Cough?: No Shortness of breath?: No  Endocrine Excessive thirst?: No  Musculoskeletal Back pain?: Yes Joint pain?: Yes  Neurological Headaches?: Yes Dizziness?: No  Psychologic Depression?: No Anxiety?: No  Physical Exam: BP (!) 158/77 (BP Location: Right Arm, Patient Position: Sitting, Cuff Size: Normal)   Pulse 82   Ht 5' 7.5" (1.715 m)   Wt 160 lb 6.4 oz (72.8 kg)   BMI 24.75 kg/m   Constitutional:  Alert and oriented, No acute distress. HEENT: [place] AT, moist mucus membranes.  Trachea midline, no masses. Cardiovascular: No clubbing, cyanosis, or edema. Respiratory: Normal respiratory effort, no increased work of breathing. GI: Abdomen is soft, nontender, nondistended, no abdominal masses GU: No CVA tenderness.  Skin: No rashes, bruises or suspicious lesions. Lymph: No cervical or inguinal adenopathy. Neurologic: Grossly intact, no focal deficits, moving all 4 extremities. Psychiatric: Normal mood and affect.  Laboratory Data: No results found for: WBC, HGB, HCT, MCV, PLT  No results found for: CREATININE  No results found for: PSA  No results found for: TESTOSTERONE  No results found for: HGBA1C  Urinalysis    Component Value Date/Time   COLORURINE Yellow 07/28/2012 1357   APPEARANCEUR Hazy 07/28/2012  1357   LABSPEC 1.027 07/28/2012 1357   PHURINE 6.0 07/28/2012 1357   GLUCOSEU >=500 07/28/2012 1357   HGBUR 1+ 07/28/2012 1357   BILIRUBINUR Negative 07/28/2012 1357   KETONESUR 1+ 07/28/2012 1357   PROTEINUR 30 mg/dL 07/28/2012 1357   NITRITE Negative 07/28/2012 1357   LEUKOCYTESUR 2+ 07/28/2012 1357     Assessment & Plan:    1.  Microscopic hematuria Follow-up for cystoscopy after CT urogram  Return for for cysto after CT.  Nickie Retort, MD  [facility] [address], [place], [place] [address] [phone]

## 2017-11-03 ENCOUNTER — Ambulatory Visit: Admission: RE | Admit: 2017-11-03 | Payer: Medicare Other | Source: Ambulatory Visit

## 2017-11-03 ENCOUNTER — Ambulatory Visit
Admission: RE | Admit: 2017-11-03 | Discharge: 2017-11-03 | Disposition: A | Discharge status: Home / Self Care | Payer: Medicare Other | Source: Ambulatory Visit | Attending: Urology | Admitting: Urology

## 2017-11-03 DIAGNOSIS — M5137 Other intervertebral disc degeneration, lumbosacral region: Secondary | ICD-10-CM | POA: Diagnosis not present

## 2017-11-03 DIAGNOSIS — R3129 Other microscopic hematuria: Secondary | ICD-10-CM | POA: Insufficient documentation

## 2017-11-03 DIAGNOSIS — I700 Atherosclerosis of aorta: Secondary | ICD-10-CM | POA: Diagnosis not present

## 2017-11-03 DIAGNOSIS — R911 Solitary pulmonary nodule: Secondary | ICD-10-CM | POA: Diagnosis not present

## 2017-11-03 DIAGNOSIS — N200 Calculus of kidney: Secondary | ICD-10-CM | POA: Diagnosis not present

## 2017-11-03 MED ORDER — IOPAMIDOL (ISOVUE-300) INJECTION 61%
125.0000 mL | Freq: Once | INTRAVENOUS | Status: AC | PRN
  Administered 2017-11-03: 125 mL via INTRAVENOUS

## 2017-11-11 ENCOUNTER — Ambulatory Visit (INDEPENDENT_AMBULATORY_CARE_PROVIDER_SITE_OTHER): Payer: Medicare Other | Admitting: Urology

## 2017-11-11 VITALS — BP 171/90 | HR 90 | Ht 67.5 in | Wt 169.7 lb

## 2017-11-11 DIAGNOSIS — R3129 Other microscopic hematuria: Secondary | ICD-10-CM

## 2017-11-11 LAB — URINALYSIS, COMPLETE
BILIRUBIN UA: NEGATIVE
GLUCOSE, UA: NEGATIVE
KETONES UA: NEGATIVE
NITRITE UA: NEGATIVE
Protein, UA: NEGATIVE
UUROB: 0.2 mg/dL (ref 0.2–1.0)
pH, UA: 6 (ref 5.0–7.5)

## 2017-11-11 LAB — MICROSCOPIC EXAMINATION
Bacteria, UA: NONE SEEN
WBC UA: NONE SEEN /HPF (ref 0–?)

## 2017-11-11 MED ORDER — CIPROFLOXACIN HCL 500 MG PO TABS
500.0000 mg | ORAL_TABLET | Freq: Once | ORAL | Status: AC
  Administered 2017-11-11: 500 mg via ORAL

## 2017-11-11 MED ORDER — LIDOCAINE HCL 2 % EX GEL
1.0000 "application " | Freq: Once | CUTANEOUS | Status: AC
  Administered 2017-11-11: 1 via URETHRAL

## 2017-11-11 NOTE — Progress Notes (Signed)
   11/11/17  CC:  Chief Complaint  Patient presents with  . Cysto    HPI: The patient is a 67 year old female who presents today for evaluation of microscopic hematuria this was seen on urinalysis her primary care providers were 4-10 red blood cells per high field were encountered.  Patient notes no history of gross hematuria.  No recent urinary tract infection.  No difficulties with urination.  No history of nephrolithiasis.  No current complaints.  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  CT hematuria was negative except for a punctate nonobstructive left mid renal calculus which is not clinically significant per.  She did have a 4 mm right middle lobe pulmonary nodule that may need further follow-up.  There were no vitals taken for this visit. NED. A&Ox3.   No respiratory distress   Abd soft, NT, ND Normal external genitalia with patent urethral meatus  Cystoscopy Procedure Note  Patient identification was confirmed, informed consent was obtained, and patient was prepped using Betadine solution.  Lidocaine jelly was administered per urethral meatus.    Preoperative abx where received prior to procedure.    Procedure: - Flexible cystoscope introduced, without any difficulty.   - Thorough search of the bladder revealed:    normal urethral meatus    normal urothelium    no stones    no ulcers     no tumors    no urethral polyps    no trabeculation  - Ureteral orifices were normal in position and appearance.  Post-Procedure: - Patient tolerated the procedure well  Assessment/ Plan:  1. Microscopic hematuria Negative workup.  Punctate left renal stone is not clinically significant.  Follow-up in 1 year for repeat urinalysis.  2.  4 mm right middle lobe pulmonary nodule Discussed the patient that depending on her risk stratification that she may need a noncontrast chest CT in 12 months.  I have  given her a copy of this report.  She will discuss this with her primary care provider and decide if this is warranted for her in the future.  She voiced understanding.  Nickie Retort, MD

## 2017-12-03 DIAGNOSIS — D3131 Benign neoplasm of right choroid: Secondary | ICD-10-CM | POA: Insufficient documentation

## 2018-01-01 IMAGING — CR DG FOREARM 2V*L*
2 series · 2 of 2 positions shown · non-contrast
Comparison: None.

CLINICAL DATA: Pain following fall

EXAM:
LEFT FOREARM - 2 VIEW

[forearm ap]
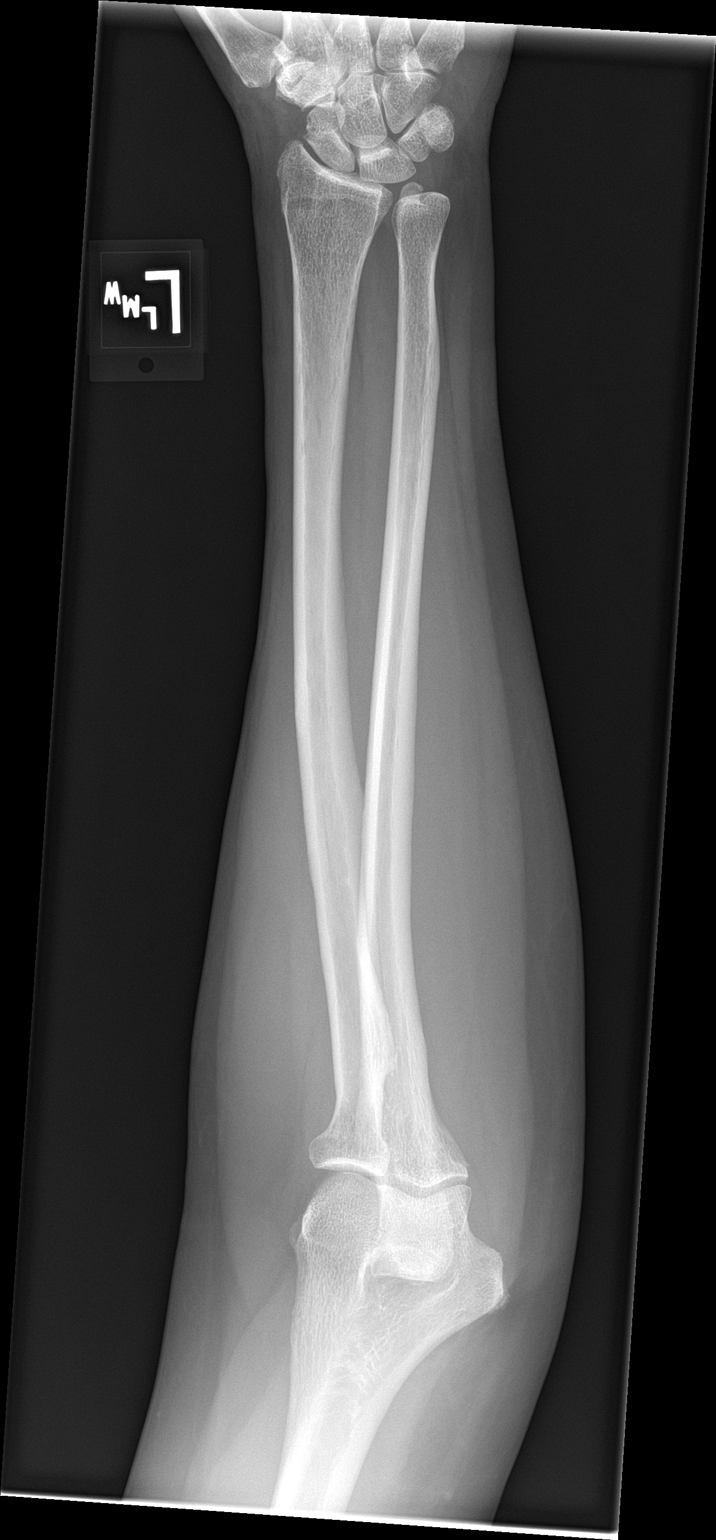

[forearm lat]
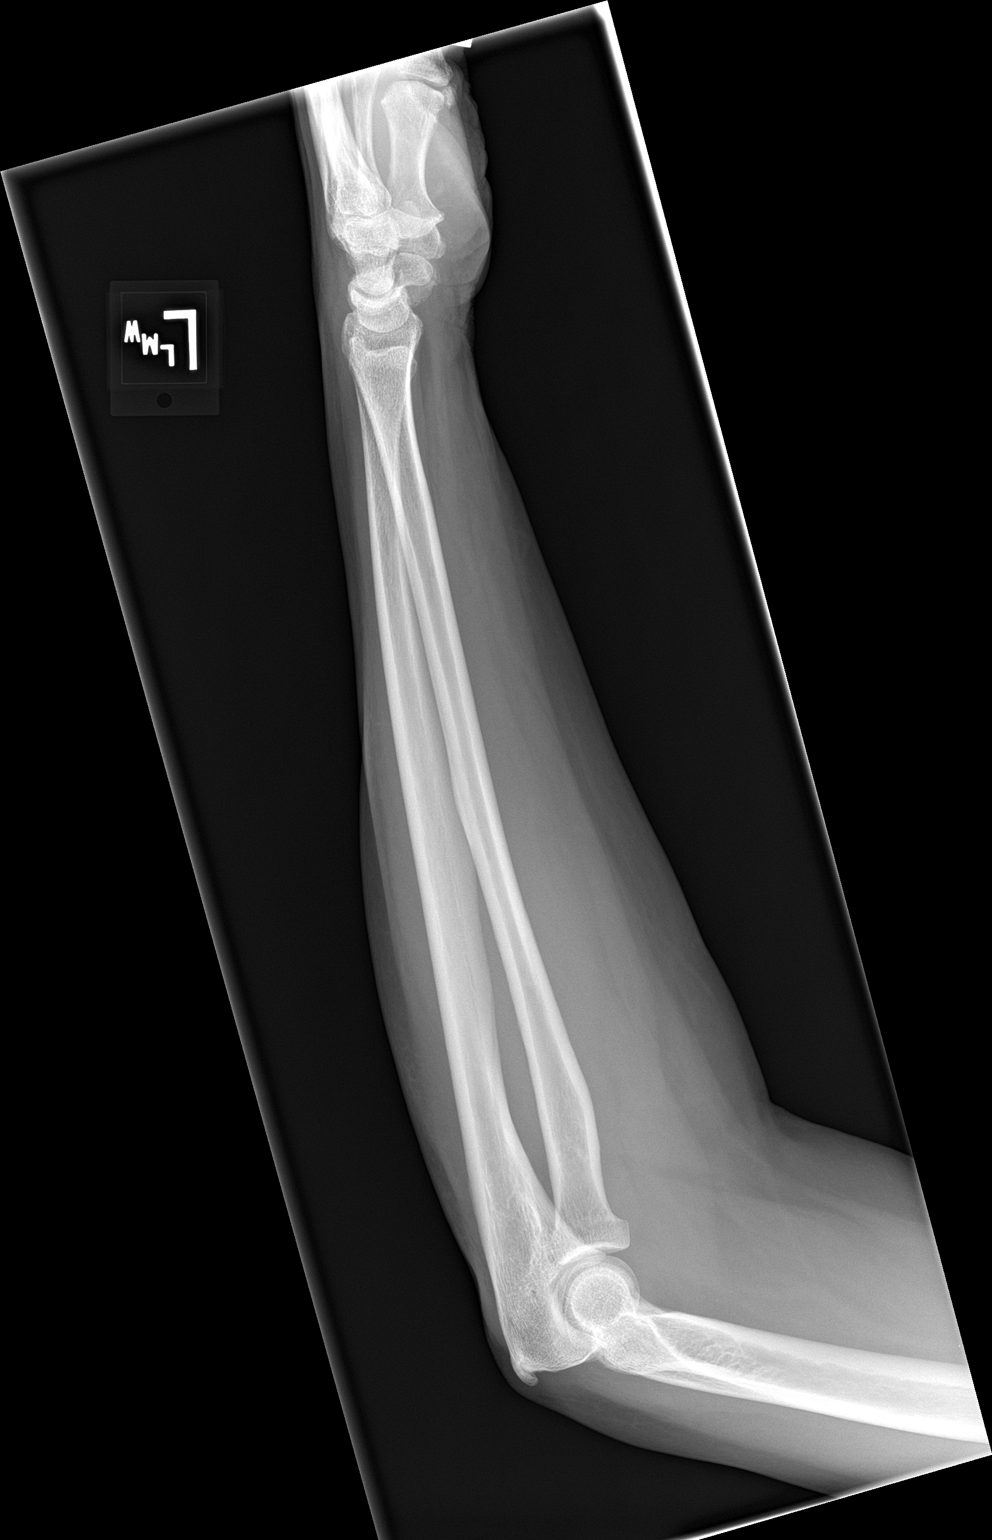

[2 of 2 positions shown; findings below may reference images not displayed]

FINDINGS: Frontal and lateral views were obtained. There is no fracture or
dislocation. The joint spaces appear normal. There is a small spur
arising from the olecranon process of the proximal ulna. No elbow
joint effusion.
IMPRESSION: Small proximal ulnar olecranon process spur. No fracture or
dislocation.

## 2018-07-06 ENCOUNTER — Encounter: Payer: Self-pay | Admitting: Emergency Medicine

## 2018-07-06 ENCOUNTER — Emergency Department
Admission: EM | Admit: 2018-07-06 | Discharge: 2018-07-06 | Disposition: A | Discharge status: Home / Self Care | Payer: Medicare Other | Attending: Student in an Organized Health Care Education/Training Program | Admitting: Student in an Organized Health Care Education/Training Program

## 2018-07-06 DIAGNOSIS — Z7984 Long term (current) use of oral hypoglycemic drugs: Secondary | ICD-10-CM | POA: Diagnosis not present

## 2018-07-06 DIAGNOSIS — F17210 Nicotine dependence, cigarettes, uncomplicated: Secondary | ICD-10-CM | POA: Diagnosis not present

## 2018-07-06 DIAGNOSIS — Z7982 Long term (current) use of aspirin: Secondary | ICD-10-CM | POA: Diagnosis not present

## 2018-07-06 DIAGNOSIS — Z79899 Other long term (current) drug therapy: Secondary | ICD-10-CM | POA: Insufficient documentation

## 2018-07-06 DIAGNOSIS — S199XXA Unspecified injury of neck, initial encounter: Secondary | ICD-10-CM | POA: Diagnosis present

## 2018-07-06 DIAGNOSIS — Y9389 Activity, other specified: Secondary | ICD-10-CM | POA: Insufficient documentation

## 2018-07-06 DIAGNOSIS — I10 Essential (primary) hypertension: Secondary | ICD-10-CM | POA: Diagnosis not present

## 2018-07-06 DIAGNOSIS — Y999 Unspecified external cause status: Secondary | ICD-10-CM | POA: Diagnosis not present

## 2018-07-06 DIAGNOSIS — S39012A Strain of muscle, fascia and tendon of lower back, initial encounter: Secondary | ICD-10-CM | POA: Diagnosis not present

## 2018-07-06 DIAGNOSIS — Y92410 Unspecified street and highway as the place of occurrence of the external cause: Secondary | ICD-10-CM | POA: Diagnosis not present

## 2018-07-06 DIAGNOSIS — E119 Type 2 diabetes mellitus without complications: Secondary | ICD-10-CM | POA: Insufficient documentation

## 2018-07-06 DIAGNOSIS — S161XXA Strain of muscle, fascia and tendon at neck level, initial encounter: Secondary | ICD-10-CM | POA: Diagnosis not present

## 2018-07-06 DIAGNOSIS — E039 Hypothyroidism, unspecified: Secondary | ICD-10-CM | POA: Diagnosis not present

## 2018-07-06 MED ORDER — METHOCARBAMOL 500 MG PO TABS: 500.0000 mg | ORAL_TABLET | Freq: Four times a day (QID) | ORAL | 0 refills | Status: DC

## 2018-07-06 MED ORDER — PREDNISONE 50 MG PO TABS: 50.0000 mg | ORAL_TABLET | Freq: Every day | ORAL | 0 refills | Status: DC
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
## 2018-10-25 DIAGNOSIS — E1165 Type 2 diabetes mellitus with hyperglycemia: Secondary | ICD-10-CM | POA: Insufficient documentation

## 2018-10-25 DIAGNOSIS — E119 Type 2 diabetes mellitus without complications: Secondary | ICD-10-CM | POA: Insufficient documentation

## 2018-10-25 DIAGNOSIS — Z794 Long term (current) use of insulin: Secondary | ICD-10-CM

## 2018-11-15 ENCOUNTER — Ambulatory Visit (INDEPENDENT_AMBULATORY_CARE_PROVIDER_SITE_OTHER): Payer: Medicare Other | Admitting: Urology

## 2018-11-15 ENCOUNTER — Encounter: Payer: Self-pay | Admitting: Urology

## 2018-11-15 VITALS — BP 172/64 | HR 89 | Ht 67.5 in | Wt 162.0 lb

## 2018-11-15 DIAGNOSIS — R3129 Other microscopic hematuria: Secondary | ICD-10-CM

## 2018-11-15 LAB — URINALYSIS, COMPLETE
Bilirubin, UA: NEGATIVE
Glucose, UA: NEGATIVE
Ketones, UA: NEGATIVE
Nitrite, UA: NEGATIVE
Protein, UA: NEGATIVE
Specific Gravity, UA: <1.005 — ABNORMAL LOW (ref 1.005–1.030)
Urobilinogen, Ur: 0.2 mg/dL (ref 0.2–1.0)
pH, UA: 6 (ref 5.0–7.5)

## 2018-11-15 LAB — MICROSCOPIC EXAMINATION

## 2018-11-15 NOTE — Progress Notes (Signed)
11/15/2018 1:41 PM   Sara Pittman 05-Jun-1951 [id number]  Referring provider: Tracie Harrier, MD [address] [facility] [place], [place] [address]  Chief Complaint  Patient presents with  . microscopic hematuria    1 yr follow up    HPI: 68 year old female who saw Dr. Pilar Jarvis in January 2019 for asymptomatic microhematuria.  CTU showed no significant upper tract abnormalities.  She had a punctate left renal calculus.  She presents for annual follow-up and has no complaints today.  She specifically denies flank, abdominal, pelvic pain.  She has no bothersome lower urinary tract symptoms and denies dysuria, gross hematuria.  She had urinalyses at [facility] in April, August and December 2019 all showing no significant RBCs on microscopy.  PMH: Past Medical History:  Diagnosis Date  . Diabetes mellitus without complication ([place])   . Edema    feet/leg  . GERD (gastroesophageal reflux disease)   . Heart murmur   . Hypertension   . Hypothyroidism     Surgical History: Past Surgical History:  Procedure Laterality Date  . ABDOMINAL HYSTERECTOMY    . AMPUTATION     tip of finger  . BIOPSY THYROID    . CATARACT EXTRACTION W/PHACO Right 02/16/2017   Procedure: CATARACT EXTRACTION PHACO AND INTRAOCULAR LENS PLACEMENT (IOC);  Surgeon: Birder Robson, MD;  Location: [facility] ORS;  Service: Ophthalmology;  Laterality: Right;  [place] 52.5 AP% 17.7 CDE 9.28 FLUID PACK LOT # [id number]  . CATARACT EXTRACTION W/PHACO Left 03/09/2017   Procedure: CATARACT EXTRACTION PHACO AND INTRAOCULAR LENS PLACEMENT (IOC);  Surgeon: Birder Robson, MD;  Location: [facility] ORS;  Service: Ophthalmology;  Laterality: Left;  [place] 1:02.2 AP% 19.8 CDE 12.30 Fluid Pack Lot # [id number]    Home Medications:  Allergies as of 11/15/2018      Reactions   Crestor [rosuvastatin] Other (See Comments)   All over aches      Medication List       Accurate as of November 15, 2018  1:41 PM.  Always use your most recent med list.        aspirin EC 81 MG tablet Take 81 mg by mouth daily.   cholecalciferol 1000 units tablet Commonly known as:  VITAMIN D Take 1,000 Units by mouth daily.   Dulaglutide 1.5 MG/0.5ML Sopn Inject into the skin.   glipiZIDE 10 MG tablet Commonly known as:  GLUCOTROL Take 10 mg by mouth 2 (two) times daily before a meal.   losartan 100 MG tablet Commonly known as:  COZAAR Take 100 mg by mouth daily.   metFORMIN 1000 MG tablet Commonly known as:  GLUCOPHAGE Take 1,000 mg by mouth 2 (two) times daily with a meal.   metoprolol succinate 25 MG 24 hr tablet Commonly known as:  TOPROL-XL Take by mouth.   omeprazole 20 MG capsule Commonly known as:  PRILOSEC Take 20 mg by mouth daily.   simvastatin 80 MG tablet Commonly known as:  ZOCOR Take 80 mg by mouth at bedtime.   tiZANidine 2 MG tablet Commonly known as:  ZANAFLEX Take by mouth.   topiramate 25 MG tablet Commonly known as:  TOPAMAX Take by mouth.       Allergies:  Allergies  Allergen Reactions  . Crestor [Rosuvastatin] Other (See Comments)    All over aches     Family History: Family History  Problem Relation Age of Onset  . Bladder Cancer Neg Hx   . Kidney cancer Neg Hx   . Prostate  cancer Neg Hx     Social History:  reports that she has been smoking cigarettes. She has been smoking about 0.50 packs per day. She has never used smokeless tobacco. She reports that she does not drink alcohol or use drugs.  ROS: UROLOGY Frequent Urination?: No Hard to postpone urination?: No Burning/pain with urination?: No Get up at night to urinate?: No Leakage of urine?: No Urine stream starts and stops?: No Trouble starting stream?: No Do you have to strain to urinate?: No Blood in urine?: No Urinary tract infection?: No Sexually transmitted disease?: No Injury to kidneys or bladder?: No Painful intercourse?: No Weak stream?: No Currently pregnant?: No Vaginal  bleeding?: No Last menstrual period?: Hysterectomy  Gastrointestinal Nausea?: No Vomiting?: No Indigestion/heartburn?: No Diarrhea?: No Constipation?: No  Constitutional Fever: No Night sweats?: No Weight loss?: No Fatigue?: No  Skin Skin rash/lesions?: No Itching?: No  Eyes Blurred vision?: No Double vision?: No  Ears/Nose/Throat Sore throat?: No Sinus problems?: No  Hematologic/Lymphatic Swollen glands?: No Easy bruising?: No  Cardiovascular Leg swelling?: No Chest pain?: No  Respiratory Cough?: No Shortness of breath?: No  Endocrine Excessive thirst?: No  Musculoskeletal Back pain?: No Joint pain?: No  Neurological Headaches?: No Dizziness?: No  Psychologic Depression?: No Anxiety?: No  Physical Exam: BP (!) 172/64 (BP Location: Left Arm, Patient Position: Sitting, Cuff Size: Normal)   Pulse 89   Ht 5' 7.5" (1.715 m)   Wt 162 lb (73.5 kg)   BMI 25.00 kg/m   Constitutional:  Alert and oriented, No acute distress.   Laboratory Data:  Urinalysis Dipstick trace blood/2+ leukocytes Microscopy 11-30 WBC/0-2 RBC   Assessment & Plan:    1. Microscopic hematuria 68 year old female with previous negative microhematuria evaluation.  Urinalysis today as well as several urinalyses in 2019 showed no significant microscopic hematuria.  She does have pyuria today but is asymptomatic and will not evaluate.  Recommend at least annual follow-up with her PCP.  It was explained that dipstick hematuria is not significant and can only be reliably evaluated by microscopy.  Follow-up as needed for any significant increased microhematuria   Abbie Sons, MD  [facility] [address], [place],  [address] [phone]

## 2019-02-28 ENCOUNTER — Other Ambulatory Visit: Payer: Self-pay | Admitting: Internal Medicine

## 2019-02-28 DIAGNOSIS — R918 Other nonspecific abnormal finding of lung field: Secondary | ICD-10-CM

## 2019-04-04 ENCOUNTER — Ambulatory Visit: Payer: Medicare Other

## 2019-06-29 ENCOUNTER — Other Ambulatory Visit: Payer: Self-pay | Admitting: Internal Medicine

## 2019-06-29 DIAGNOSIS — Z1231 Encounter for screening mammogram for malignant neoplasm of breast: Secondary | ICD-10-CM

## 2019-08-30 ENCOUNTER — Ambulatory Visit
Admission: RE | Admit: 2019-08-30 | Discharge: 2019-08-30 | Disposition: A | Discharge status: Home / Self Care | Payer: Medicare Other | Source: Ambulatory Visit | Attending: Internal Medicine | Admitting: Internal Medicine

## 2019-08-30 ENCOUNTER — Other Ambulatory Visit: Payer: Self-pay

## 2019-08-30 DIAGNOSIS — Z1231 Encounter for screening mammogram for malignant neoplasm of breast: Secondary | ICD-10-CM | POA: Insufficient documentation

## 2020-07-18 ENCOUNTER — Other Ambulatory Visit: Payer: Self-pay | Admitting: Internal Medicine

## 2020-07-18 DIAGNOSIS — R7989 Other specified abnormal findings of blood chemistry: Secondary | ICD-10-CM

## 2020-07-18 DIAGNOSIS — E042 Nontoxic multinodular goiter: Secondary | ICD-10-CM

## 2021-03-04 ENCOUNTER — Encounter: Payer: Self-pay | Admitting: Gastroenterology

## 2021-03-31 ENCOUNTER — Other Ambulatory Visit: Payer: Self-pay | Admitting: Internal Medicine

## 2021-03-31 DIAGNOSIS — Z1231 Encounter for screening mammogram for malignant neoplasm of breast: Secondary | ICD-10-CM

## 2021-04-18 ENCOUNTER — Ambulatory Visit
Admission: RE | Admit: 2021-04-18 | Discharge: 2021-04-18 | Disposition: A | Discharge status: Home / Self Care | Payer: Medicare Other | Source: Ambulatory Visit | Attending: Internal Medicine | Admitting: Internal Medicine

## 2021-04-18 ENCOUNTER — Other Ambulatory Visit: Payer: Self-pay

## 2021-04-18 DIAGNOSIS — Z1231 Encounter for screening mammogram for malignant neoplasm of breast: Secondary | ICD-10-CM | POA: Diagnosis not present

## 2022-04-13 ENCOUNTER — Other Ambulatory Visit: Payer: Self-pay | Admitting: Internal Medicine

## 2022-04-13 DIAGNOSIS — Z1231 Encounter for screening mammogram for malignant neoplasm of breast: Secondary | ICD-10-CM

## 2022-05-06 ENCOUNTER — Ambulatory Visit
Admission: RE | Admit: 2022-05-06 | Discharge: 2022-05-06 | Disposition: A | Discharge status: Home / Self Care | Payer: Medicare Other | Source: Ambulatory Visit | Attending: Internal Medicine | Admitting: Internal Medicine

## 2022-05-06 DIAGNOSIS — Z1231 Encounter for screening mammogram for malignant neoplasm of breast: Secondary | ICD-10-CM | POA: Insufficient documentation

## 2022-07-23 ENCOUNTER — Emergency Department: Payer: Medicare Other

## 2022-07-23 ENCOUNTER — Inpatient Hospital Stay
Admission: EM | Admit: 2022-07-23 | Discharge: 2022-07-30 | DRG: 094 | Disposition: A | Discharge status: Other Acute Inpatient Hospital | Payer: Medicare Other | Attending: Internal Medicine | Admitting: Internal Medicine

## 2022-07-23 ENCOUNTER — Other Ambulatory Visit: Payer: Self-pay

## 2022-07-23 DIAGNOSIS — G8250 Quadriplegia, unspecified: Secondary | ICD-10-CM | POA: Diagnosis present

## 2022-07-23 DIAGNOSIS — E039 Hypothyroidism, unspecified: Secondary | ICD-10-CM | POA: Diagnosis present

## 2022-07-23 DIAGNOSIS — R29898 Other symptoms and signs involving the musculoskeletal system: Secondary | ICD-10-CM | POA: Diagnosis not present

## 2022-07-23 DIAGNOSIS — Z79899 Other long term (current) drug therapy: Secondary | ICD-10-CM

## 2022-07-23 DIAGNOSIS — T380X5A Adverse effect of glucocorticoids and synthetic analogues, initial encounter: Secondary | ICD-10-CM | POA: Diagnosis not present

## 2022-07-23 DIAGNOSIS — R112 Nausea with vomiting, unspecified: Secondary | ICD-10-CM | POA: Diagnosis present

## 2022-07-23 DIAGNOSIS — I10 Essential (primary) hypertension: Secondary | ICD-10-CM | POA: Diagnosis present

## 2022-07-23 DIAGNOSIS — C7802 Secondary malignant neoplasm of left lung: Secondary | ICD-10-CM | POA: Diagnosis present

## 2022-07-23 DIAGNOSIS — R29810 Facial weakness: Secondary | ICD-10-CM | POA: Diagnosis present

## 2022-07-23 DIAGNOSIS — R262 Difficulty in walking, not elsewhere classified: Principal | ICD-10-CM | POA: Diagnosis present

## 2022-07-23 DIAGNOSIS — F17210 Nicotine dependence, cigarettes, uncomplicated: Secondary | ICD-10-CM | POA: Diagnosis present

## 2022-07-23 DIAGNOSIS — G610 Guillain-Barre syndrome: Secondary | ICD-10-CM | POA: Diagnosis not present

## 2022-07-23 DIAGNOSIS — Z1152 Encounter for screening for COVID-19: Secondary | ICD-10-CM

## 2022-07-23 DIAGNOSIS — Z7984 Long term (current) use of oral hypoglycemic drugs: Secondary | ICD-10-CM

## 2022-07-23 DIAGNOSIS — C439 Malignant melanoma of skin, unspecified: Secondary | ICD-10-CM | POA: Diagnosis present

## 2022-07-23 DIAGNOSIS — E119 Type 2 diabetes mellitus without complications: Secondary | ICD-10-CM

## 2022-07-23 DIAGNOSIS — E063 Autoimmune thyroiditis: Secondary | ICD-10-CM | POA: Diagnosis present

## 2022-07-23 DIAGNOSIS — Z888 Allergy status to other drugs, medicaments and biological substances status: Secondary | ICD-10-CM

## 2022-07-23 DIAGNOSIS — C6940 Malignant neoplasm of unspecified ciliary body: Secondary | ICD-10-CM

## 2022-07-23 DIAGNOSIS — Z7985 Long-term (current) use of injectable non-insulin antidiabetic drugs: Secondary | ICD-10-CM

## 2022-07-23 DIAGNOSIS — R45851 Suicidal ideations: Secondary | ICD-10-CM | POA: Diagnosis not present

## 2022-07-23 DIAGNOSIS — K219 Gastro-esophageal reflux disease without esophagitis: Secondary | ICD-10-CM | POA: Diagnosis present

## 2022-07-23 DIAGNOSIS — F322 Major depressive disorder, single episode, severe without psychotic features: Secondary | ICD-10-CM | POA: Diagnosis present

## 2022-07-23 DIAGNOSIS — E876 Hypokalemia: Secondary | ICD-10-CM | POA: Diagnosis not present

## 2022-07-23 DIAGNOSIS — Z79890 Hormone replacement therapy (postmenopausal): Secondary | ICD-10-CM

## 2022-07-23 DIAGNOSIS — T451X5A Adverse effect of antineoplastic and immunosuppressive drugs, initial encounter: Secondary | ICD-10-CM | POA: Diagnosis present

## 2022-07-23 DIAGNOSIS — R270 Ataxia, unspecified: Secondary | ICD-10-CM | POA: Diagnosis present

## 2022-07-23 LAB — COMPREHENSIVE METABOLIC PANEL
ALT: 13 U/L (ref 0–44)
AST: 20 U/L (ref 15–41)
Albumin: 4.7 g/dL (ref 3.5–5.0)
Alkaline Phosphatase: 51 U/L (ref 38–126)
Anion gap: 20 — ABNORMAL HIGH (ref 5–15)
BUN: 12 mg/dL (ref 8–23)
CO2: 19 mmol/L — ABNORMAL LOW (ref 22–32)
Calcium: 11.3 mg/dL — ABNORMAL HIGH (ref 8.9–10.3)
Chloride: 98 mmol/L (ref 98–111)
Creatinine, Ser: 0.89 mg/dL (ref 0.44–1.00)
GFR, Estimated: >60 mL/min (ref >60)
Glucose, Bld: 142 mg/dL — ABNORMAL HIGH (ref 70–99)
Potassium: 3.7 mmol/L (ref 3.5–5.1)
Sodium: 137 mmol/L (ref 135–145)
Total Bilirubin: 1.9 mg/dL — ABNORMAL HIGH (ref 0.3–1.2)
Total Protein: 8.1 g/dL (ref 6.5–8.1)

## 2022-07-23 LAB — DIFFERENTIAL
Abs Immature Granulocytes: 0.03 10*3/uL (ref 0.00–0.07)
Basophils Absolute: 0 10*3/uL (ref 0.0–0.1)
Basophils Relative: 1 %
Eosinophils Absolute: 0 10*3/uL (ref 0.0–0.5)
Eosinophils Relative: 0 %
Immature Granulocytes: 0 %
Lymphocytes Relative: 13 %
Lymphs Abs: 1 10*3/uL (ref 0.7–4.0)
Monocytes Absolute: 0.3 10*3/uL (ref 0.1–1.0)
Monocytes Relative: 4 %
Neutro Abs: 6.2 10*3/uL (ref 1.7–7.7)
Neutrophils Relative %: 82 %

## 2022-07-23 LAB — RESP PANEL BY RT-PCR (FLU A&B, COVID) ARPGX2
Influenza A by PCR: NEGATIVE
Influenza B by PCR: NEGATIVE
SARS Coronavirus 2 by RT PCR: NEGATIVE

## 2022-07-23 LAB — CBC
HCT: 44.7 % (ref 36.0–46.0)
Hemoglobin: 14.8 g/dL (ref 12.0–15.0)
MCH: 27.7 pg (ref 26.0–34.0)
MCHC: 33.1 g/dL (ref 30.0–36.0)
MCV: 83.7 fL (ref 80.0–100.0)
Platelets: 384 10*3/uL (ref 150–400)
RBC: 5.34 MIL/uL — ABNORMAL HIGH (ref 3.87–5.11)
RDW: 14.7 % (ref 11.5–15.5)
WBC: 7.6 10*3/uL (ref 4.0–10.5)
nRBC: 0 % (ref 0.0–0.2)

## 2022-07-23 LAB — PROTIME-INR
INR: 1 (ref 0.8–1.2)
Prothrombin Time: 13.1 seconds (ref 11.4–15.2)

## 2022-07-23 LAB — LACTIC ACID, PLASMA
Lactic Acid, Venous: 1.6 mmol/L (ref 0.5–1.9)
Lactic Acid, Venous: 1.3 mmol/L (ref 0.5–1.9)

## 2022-07-23 LAB — CBG MONITORING, ED: Glucose-Capillary: 153 mg/dL — ABNORMAL HIGH (ref 70–99)

## 2022-07-23 LAB — TROPONIN I (HIGH SENSITIVITY)
Troponin I (High Sensitivity): 18 ng/L — ABNORMAL HIGH (ref <18)
Troponin I (High Sensitivity): 25 ng/L — ABNORMAL HIGH (ref <18)

## 2022-07-23 LAB — APTT: aPTT: 24 seconds (ref 24–36)

## 2022-07-23 MED ORDER — ONDANSETRON HCL 4 MG/2ML IJ SOLN
INTRAMUSCULAR | Status: AC
  Administered 2022-07-23: 4 mg via INTRAVENOUS
  Filled 2022-07-23: qty 2

## 2022-07-23 MED ORDER — ONDANSETRON HCL 4 MG/2ML IJ SOLN: 4.0000 mg | Freq: Once | INTRAMUSCULAR | Status: AC

## 2022-07-23 MED ORDER — SODIUM CHLORIDE 0.9 % IV SOLN: Freq: Once | INTRAVENOUS | Status: DC

## 2022-07-23 NOTE — ED Notes (Signed)
First Nurse Note: Patient to ED via [facility] from home for increasing weakness over the past week. Two falls this AM and unable to bear weight due to them "giving out." Getting tx for melanoma on the lung. Has had N/V- normal after treatment (last treatment on Friday).  VS: 98.6 107 HR 98% 189/109

## 2022-07-23 NOTE — ED Provider Notes (Signed)
[facility] Provider Note    Event Date/Time   First MD Initiated Contact with Patient 07/23/22 1623     (approximate)   History   Weakness   HPI  Sara Pittman is a 71 y.o. female  who presents to the emergency department today because of concern for bilateral leg weakness. The patient symptoms started roughly a week ago, after the patient received her immunotherapy. Progressively has gotten worse. She is having significant difficulty with ambulation and has fallen two times. She feels like someone kicks her legs out from under her. She has had associated low back pain with the weakness. No fevers. Had some chills since she has been in the emergency department. Denies any change to urination or defecation.    Physical Exam   Triage Vital Signs: ED Triage Vitals  Enc Vitals Group     BP 07/23/22 1200 (!) 134/92     Pulse Rate 07/23/22 1200 (!) 103     Resp 07/23/22 1200 20     Temp 07/23/22 1200 98.6 F (37 C)     Temp Source 07/23/22 1200 Oral     SpO2 07/23/22 1200 94 %     Weight 07/23/22 1200 146 lb (66.2 kg)     Height 07/23/22 1200 '5\' 7"'$  (1.702 m)     Head Circumference --      Peak Flow --      Pain Score 07/23/22 1211 10     Pain Loc --      Pain Edu? --      Excl. in [place]? --     Most recent vital signs: Vitals:   07/23/22 1626 07/23/22 1706  BP: (!) 177/117 (!) 196/118  Pulse: 95 (!) 106  Resp: 15 (!) 33  Temp:    SpO2: 100% 96%    General: Awake, alert, oriented. CV:  Good peripheral perfusion.  Resp:  Normal effort. Lungs clear. Abd:  No distention.  Neuro:  Awake, alert and oriented. Strength 4/5 in bilateral lower extremities.    ED Results / Procedures / Treatments   Labs (all labs ordered are listed, but only abnormal results are displayed) Labs Reviewed  CBC - Abnormal; Notable for the following components:      Result Value   RBC 5.34 (*)    All other components within normal limits  COMPREHENSIVE  METABOLIC PANEL - Abnormal; Notable for the following components:   CO2 19 (*)    Glucose, Bld 142 (*)    Calcium 11.3 (*)    Total Bilirubin 1.9 (*)    Anion gap 20 (*)    All other components within normal limits  CBG MONITORING, ED - Abnormal; Notable for the following components:   Glucose-Capillary 153 (*)    All other components within normal limits  TROPONIN I (HIGH SENSITIVITY) - Abnormal; Notable for the following components:   Troponin I (High Sensitivity) 18 (*)    All other components within normal limits  TROPONIN I (HIGH SENSITIVITY) - Abnormal; Notable for the following components:   Troponin I (High Sensitivity) 25 (*)    All other components within normal limits  RESP PANEL BY RT-PCR (FLU A&B, COVID) ARPGX2  PROTIME-INR  APTT  DIFFERENTIAL  LACTIC ACID, PLASMA  LACTIC ACID, PLASMA  URINE DRUG SCREEN, QUALITATIVE ([facility] ONLY)  URINALYSIS, ROUTINE W REFLEX MICROSCOPIC  LACTIC ACID, PLASMA     EKG  I, Nance Pear, attending physician, personally viewed and interpreted this EKG  EKG  Time: 1229 Rate: 102 Rhythm: sinus tachycardia Axis: normal Intervals: qtc 448 QRS: narrow ST changes: no st elevation Impression: abnormal ekg    RADIOLOGY I independently interpreted and visualized the CT head. My interpretation: No bleed Radiology interpretation:  IMPRESSION:  Mild age related cerebral atrophy but no acute intracranial findings  or mass lesions.     PROCEDURES:  Critical Care performed: No  Procedures   MEDICATIONS ORDERED IN ED: Medications  ondansetron (ZOFRAN) injection 4 mg (4 mg Intravenous Given 07/23/22 1713)     IMPRESSION / MDM / ASSESSMENT AND PLAN / ED COURSE  I reviewed the triage vital signs and the nursing notes.                              Differential diagnosis includes, but is not limited to, neurologic disorder, cauda equina, medication side effect, infection.  Patient's presentation is most consistent with acute  presentation with potential threat to life or bodily function.  Patient presents to the emergency department today because of concerns for bilateral lower extremity weakness.  Just the point where she has fallen with ambulation.  She also complains of low back pain.  Patient has a history of cancer.  Head CT without any concerning abnormalities.  Blood work with mild elevation of troponin however no concerning electrolyte abnormalities save for some slight hypocalcemia.  While the hypercalcemia might explain some difficulty with ambulation do not feel it is significant enough to cause the patient's symptoms.  Given concern for possible cauda equina syndrome a lumbar spine MRI was ordered. Awaiting MRI at time of sign out.  FINAL CLINICAL IMPRESSION(S) / ED DIAGNOSES   Lower extremity weakness  Note:  This document was prepared using Dragon voice recognition software and may include unintentional dictation errors.    Nance Pear, MD 07/23/22 [phone]

## 2022-07-23 NOTE — ED Provider Triage Note (Signed)
Emergency Medicine Provider Triage Evaluation Note  Sara Pittman , a 71 y.o. female  was evaluated in triage.  Pt complains of numbness in the legs, left-sided facial droop.  Patient has metastatic melanoma and took a immunotherapy treatment.  Symptoms started about 3 AM..  Review of Systems  Positive: Facial droop, leg numbness Negative:   Physical Exam  BP (!) 134/92 (BP Location: Right Arm)   Pulse (!) 103   Temp 98.6 F (37 C) (Oral)   Resp 20   Ht '5\' 7"'$  (1.702 m)   Wt 66.2 kg   SpO2 94%   BMI 22.87 kg/m  Gen:   Awake, no distress   Resp:  Normal effort  MSK:   Patient sitting in wheelchair did not try to have her walk. Other:  Facial droop noted  Medical Decision Making  Medically screening exam initiated at 12:05 PM.  Appropriate orders placed.  Agustina Caroli was informed that the remainder of the evaluation will be completed by another provider, this initial triage assessment does not replace that evaluation, and the importance of remaining in the ED until their evaluation is complete.     Versie Starks, PA-C 07/23/22 1207

## 2022-07-23 NOTE — ED Notes (Signed)
Patient transported to CT 

## 2022-07-23 NOTE — ED Notes (Signed)
Lab called again to obtain labs. Verbalized understanding, state they will send someone.

## 2022-07-23 NOTE — ED Triage Notes (Signed)
Pt states her legs stopped working this morning at 3 am, pt states she can move her legs but when she tried to stand both legs give out. Pt states she has not been able to eat well since this past Sunday and zofran she was given is not helping.

## 2022-07-23 NOTE — ED Provider Notes (Incomplete)
[facility] Provider Note    Event Date/Time   First MD Initiated Contact with Patient 07/23/22 1623     (approximate)   History   Weakness   HPI {Remember to add pertinent medical, surgical, social, and/or OB history to HPI:1} Sara Pittman is a 71 y.o. female  ***       Physical Exam   Triage Vital Signs: ED Triage Vitals  Enc Vitals Group     BP 07/23/22 1200 (!) 134/92     Pulse Rate 07/23/22 1200 (!) 103     Resp 07/23/22 1200 20     Temp 07/23/22 1200 98.6 F (37 C)     Temp Source 07/23/22 1200 Oral     SpO2 07/23/22 1200 94 %     Weight 07/23/22 1200 146 lb (66.2 kg)     Height 07/23/22 1200 '5\' 7"'$  (1.702 m)     Head Circumference --      Peak Flow --      Pain Score 07/23/22 1211 10     Pain Loc --      Pain Edu? --      Excl. in [place]? --     Most recent vital signs: Vitals:   07/23/22 1211 07/23/22 1454  BP:  137/89  Pulse:  (!) 101  Resp:  18  Temp: 98.6 F (37 C) 98.4 F (36.9 C)  SpO2:  96%    {Only need to document appropriate and relevant physical exam:1} General: Awake, no distress. *** CV:  Good peripheral perfusion. *** Resp:  Normal effort. *** Abd:  No distention. *** Other:  ***   ED Results / Procedures / Treatments   Labs (all labs ordered are listed, but only abnormal results are displayed) Labs Reviewed  CBC - Abnormal; Notable for the following components:      Result Value   RBC 5.34 (*)    All other components within normal limits  COMPREHENSIVE METABOLIC PANEL - Abnormal; Notable for the following components:   CO2 19 (*)    Glucose, Bld 142 (*)    Calcium 11.3 (*)    Total Bilirubin 1.9 (*)    Anion gap 20 (*)    All other components within normal limits  CBG MONITORING, ED - Abnormal; Notable for the following components:   Glucose-Capillary 153 (*)    All other components within normal limits  TROPONIN I (HIGH SENSITIVITY) - Abnormal; Notable for the following components:    Troponin I (High Sensitivity) 18 (*)    All other components within normal limits  RESP PANEL BY RT-PCR (FLU A&B, COVID) ARPGX2  PROTIME-INR  APTT  DIFFERENTIAL  URINE DRUG SCREEN, QUALITATIVE ([facility] ONLY)  URINALYSIS, ROUTINE W REFLEX MICROSCOPIC  LACTIC ACID, PLASMA  LACTIC ACID, PLASMA  TROPONIN I (HIGH SENSITIVITY)     EKG  I, Nance Pear, attending physician, personally viewed and interpreted this EKG  EKG Time: 1229 Rate: 102 Rhythm: sinus tachycardia Axis: normal Intervals: qtc 448 QRS: narrow ST changes: no st elevation Impression: abnormal ekg    RADIOLOGY I independently interpreted and visualized the CT head. My interpretation: No bleed Radiology interpretation:  IMPRESSION:  Mild age related cerebral atrophy but no acute intracranial findings  or mass lesions.      PROCEDURES:  Critical Care performed: No  Procedures   MEDICATIONS ORDERED IN ED: Medications - No data to display   IMPRESSION / MDM / [place] / ED COURSE  I  reviewed the triage vital signs and the nursing notes.                              Differential diagnosis includes, but is not limited to, ***  Patient's presentation is most consistent with {EM COPA:27473}  {If the patient is on the monitor, remove the brackets and asterisks on the sentence below and remember to document it as a Procedure as well. Otherwise delete the sentence below:1} {**The patient is on the cardiac monitor to evaluate for evidence of arrhythmia and/or significant heart rate changes.**} {Remember to include, when applicable, any/all of the following data: independent review of imaging independent review of labs (comment specifically on pertinent positives and negatives) review of specific prior hospitalizations, PCP/specialist notes, etc. discuss meds given and prescribed document any discussion with consultants (including hospitalists) any clinical decision tools you used and why  (PECARN, NEXUS, etc.) did you consider admitting the patient? document social determinants of health affecting patient's care (homelessness, inability to follow up in a timely fashion, etc) document any pre-existing conditions increasing risk on current visit (e.g. diabetes and HTN increasing danger of high-risk chest pain/ACS) describes what meds you gave (especially parenteral) and why any other interventions?:1}     FINAL CLINICAL IMPRESSION(S) / ED DIAGNOSES   Final diagnoses:  None     Rx / DC Orders   ED Discharge Orders     None        Note:  This document was prepared using Dragon voice recognition software and may include unintentional dictation errors.

## 2022-07-23 NOTE — ED Notes (Signed)
Pt noted to be actively vomiting. Pt reports intermittent nausea is an ongoing issue for her. MD made aware; verbal order for Zofran 4 mg IV once.

## 2022-07-23 NOTE — ED Notes (Signed)
Attempted to draw from IV in place for labs and looked for vein to draw labs without success. Lab called to obtain ordered labs.

## 2022-07-23 NOTE — ED Notes (Signed)
ED staff unable to draw blood after multiple attempts, IV placed by this RN that flushes well but no blood return, Lab called to draw labs

## 2022-07-24 DIAGNOSIS — R262 Difficulty in walking, not elsewhere classified: Secondary | ICD-10-CM

## 2022-07-24 DIAGNOSIS — R12 Heartburn: Secondary | ICD-10-CM

## 2022-07-24 DIAGNOSIS — R112 Nausea with vomiting, unspecified: Secondary | ICD-10-CM | POA: Diagnosis present

## 2022-07-24 DIAGNOSIS — R29898 Other symptoms and signs involving the musculoskeletal system: Secondary | ICD-10-CM

## 2022-07-24 DIAGNOSIS — K219 Gastro-esophageal reflux disease without esophagitis: Secondary | ICD-10-CM | POA: Diagnosis present

## 2022-07-24 DIAGNOSIS — G610 Guillain-Barre syndrome: Secondary | ICD-10-CM | POA: Diagnosis present

## 2022-07-24 DIAGNOSIS — E039 Hypothyroidism, unspecified: Secondary | ICD-10-CM | POA: Diagnosis present

## 2022-07-24 LAB — T4, FREE: Free T4: 0.61 ng/dL (ref 0.61–1.12)

## 2022-07-24 LAB — URINE DRUG SCREEN, QUALITATIVE (ARMC ONLY)
Amphetamines, Ur Screen: NOT DETECTED
Barbiturates, Ur Screen: NOT DETECTED
Benzodiazepine, Ur Scrn: NOT DETECTED
Cannabinoid 50 Ng, Ur ~~LOC~~: NOT DETECTED
Cocaine Metabolite,Ur ~~LOC~~: NOT DETECTED
MDMA (Ecstasy)Ur Screen: NOT DETECTED
Methadone Scn, Ur: NOT DETECTED
Opiate, Ur Screen: POSITIVE — AB
Phencyclidine (PCP) Ur S: NOT DETECTED
Tricyclic, Ur Screen: NOT DETECTED

## 2022-07-24 LAB — URINALYSIS, ROUTINE W REFLEX MICROSCOPIC
Bacteria, UA: NONE SEEN
Bilirubin Urine: NEGATIVE
Glucose, UA: NEGATIVE mg/dL
Hgb urine dipstick: NEGATIVE
Ketones, ur: 80 mg/dL — AB
Leukocytes,Ua: NEGATIVE
Nitrite: NEGATIVE
Protein, ur: >=300 mg/dL — AB
Specific Gravity, Urine: 1.018 (ref 1.005–1.030)
pH: 5 (ref 5.0–8.0)

## 2022-07-24 LAB — TROPONIN I (HIGH SENSITIVITY)
Troponin I (High Sensitivity): 37 ng/L — ABNORMAL HIGH (ref <18)
Troponin I (High Sensitivity): 35 ng/L — ABNORMAL HIGH (ref <18)

## 2022-07-24 LAB — PHOSPHORUS: Phosphorus: 4.1 mg/dL (ref 2.5–4.6)

## 2022-07-24 LAB — MAGNESIUM: Magnesium: 1.6 mg/dL — ABNORMAL LOW (ref 1.7–2.4)

## 2022-07-24 LAB — TYPE AND SCREEN
ABO/RH(D): B POS
Antibody Screen: NEGATIVE

## 2022-07-24 LAB — VITAMIN D 25 HYDROXY (VIT D DEFICIENCY, FRACTURES): Vit D, 25-Hydroxy: 24.36 ng/mL — ABNORMAL LOW (ref 30–100)

## 2022-07-24 LAB — LACTIC ACID, PLASMA: Lactic Acid, Venous: 1.2 mmol/L (ref 0.5–1.9)

## 2022-07-24 LAB — TSH: TSH: 24.628 u[IU]/mL — ABNORMAL HIGH (ref 0.350–4.500)

## 2022-07-24 LAB — FOLATE: Folate: 13.2 ng/mL (ref >5.9)

## 2022-07-24 LAB — LIPASE, BLOOD: Lipase: 26 U/L (ref 11–51)

## 2022-07-24 LAB — CK: Total CK: 199 U/L (ref 38–234)

## 2022-07-24 LAB — VITAMIN B12
Vitamin B-12: 291 pg/mL (ref 180–914)
Vitamin B-12: 7416 pg/mL — ABNORMAL HIGH (ref 180–914)

## 2022-07-24 MED ORDER — LACTATED RINGERS IV BOLUS
1000.0000 mL | Freq: Once | INTRAVENOUS | Status: AC
  Administered 2022-07-24: 1000 mL via INTRAVENOUS

## 2022-07-24 MED ORDER — ONDANSETRON HCL 4 MG/2ML IJ SOLN
4.0000 mg | Freq: Four times a day (QID) | INTRAMUSCULAR | Status: DC | PRN
  Administered 2022-07-24 – 2022-07-29 (×3): 4 mg via INTRAVENOUS
  Filled 2022-07-24 (×3): qty 2

## 2022-07-24 MED ORDER — MAGNESIUM SULFATE 2 GM/50ML IV SOLN
2.0000 g | Freq: Once | INTRAVENOUS | Status: AC
  Administered 2022-07-24: 2 g via INTRAVENOUS
  Filled 2022-07-24: qty 50

## 2022-07-24 MED ORDER — MORPHINE SULFATE (PF) 2 MG/ML IV SOLN
2.0000 mg | INTRAVENOUS | Status: DC | PRN
  Administered 2022-07-24 – 2022-07-29 (×21): 2 mg via INTRAVENOUS
  Filled 2022-07-24 (×21): qty 1

## 2022-07-24 MED ORDER — METOPROLOL SUCCINATE ER 50 MG PO TB24
50.0000 mg | ORAL_TABLET | Freq: Every day | ORAL | Status: DC
  Administered 2022-07-24 – 2022-07-30 (×7): 50 mg via ORAL
  Filled 2022-07-24 (×7): qty 1

## 2022-07-24 MED ORDER — LACTATED RINGERS IV SOLN: INTRAVENOUS | Status: DC

## 2022-07-24 MED ORDER — LEVOTHYROXINE SODIUM 50 MCG PO TABS
75.0000 ug | ORAL_TABLET | Freq: Every day | ORAL | Status: DC
  Administered 2022-07-24: 75 ug via ORAL
  Filled 2022-07-24: qty 2

## 2022-07-24 MED ORDER — LOSARTAN POTASSIUM 50 MG PO TABS
100.0000 mg | ORAL_TABLET | Freq: Every day | ORAL | Status: DC
  Administered 2022-07-24 – 2022-07-30 (×7): 100 mg via ORAL
  Filled 2022-07-24 (×7): qty 2

## 2022-07-24 MED ORDER — ONDANSETRON HCL 4 MG PO TABS: 4.0000 mg | ORAL_TABLET | Freq: Four times a day (QID) | ORAL | Status: DC | PRN

## 2022-07-24 MED ORDER — LEVOTHYROXINE SODIUM 100 MCG PO TABS
100.0000 ug | ORAL_TABLET | Freq: Every day | ORAL | Status: DC
  Administered 2022-07-25 – 2022-07-30 (×5): 100 ug via ORAL
  Filled 2022-07-24 (×5): qty 1

## 2022-07-24 MED ORDER — ENOXAPARIN SODIUM 40 MG/0.4ML IJ SOSY
40.0000 mg | PREFILLED_SYRINGE | INTRAMUSCULAR | Status: DC
  Administered 2022-07-24 – 2022-07-26 (×3): 40 mg via SUBCUTANEOUS
  Filled 2022-07-24 (×3): qty 0.4

## 2022-07-24 MED ORDER — ASPIRIN 81 MG PO TBEC
81.0000 mg | DELAYED_RELEASE_TABLET | Freq: Every day | ORAL | Status: DC
  Administered 2022-07-25 – 2022-07-27 (×3): 81 mg via ORAL
  Filled 2022-07-24 (×3): qty 1

## 2022-07-24 MED ORDER — TOPIRAMATE 25 MG PO TABS
25.0000 mg | ORAL_TABLET | Freq: Every day | ORAL | Status: DC
  Administered 2022-07-24 – 2022-07-30 (×7): 25 mg via ORAL
  Filled 2022-07-24 (×7): qty 1

## 2022-07-24 MED ORDER — CYANOCOBALAMIN 1000 MCG/ML IJ SOLN
1000.0000 ug | Freq: Once | INTRAMUSCULAR | Status: AC
  Administered 2022-07-24: 1000 ug via INTRAMUSCULAR
  Filled 2022-07-24: qty 1

## 2022-07-24 MED ORDER — ADULT MULTIVITAMIN W/MINERALS CH
1.0000 | ORAL_TABLET | Freq: Every day | ORAL | Status: DC
  Administered 2022-07-24 – 2022-07-30 (×7): 1 via ORAL
  Filled 2022-07-24 (×7): qty 1

## 2022-07-24 MED ORDER — PANTOPRAZOLE SODIUM 40 MG IV SOLR
40.0000 mg | Freq: Two times a day (BID) | INTRAVENOUS | Status: DC
  Administered 2022-07-24 – 2022-07-25 (×4): 40 mg via INTRAVENOUS
  Filled 2022-07-24 (×4): qty 10

## 2022-07-24 MED ORDER — ACETAMINOPHEN 650 MG RE SUPP: 650.0000 mg | Freq: Four times a day (QID) | RECTAL | Status: DC | PRN

## 2022-07-24 MED ORDER — HYDRALAZINE HCL 20 MG/ML IJ SOLN
10.0000 mg | Freq: Four times a day (QID) | INTRAMUSCULAR | Status: DC | PRN
  Administered 2022-07-26: 10 mg via INTRAVENOUS
  Filled 2022-07-24: qty 1

## 2022-07-24 MED ORDER — BOOST / RESOURCE BREEZE PO LIQD CUSTOM
1.0000 | Freq: Three times a day (TID) | ORAL | Status: DC
  Administered 2022-07-24 – 2022-07-30 (×15): 1 via ORAL

## 2022-07-24 MED ORDER — ACETAMINOPHEN 325 MG PO TABS: 650.0000 mg | ORAL_TABLET | Freq: Four times a day (QID) | ORAL | Status: DC | PRN

## 2022-07-24 NOTE — TOC Initial Note (Addendum)
Transition of Care [facility]) - Initial/Assessment Note    Patient Details  Name: Sara Pittman MRN: [id number] Date of Birth: 11/08/1950  [facility] CM/SW Contact:    Coralee Pesa, [place] Phone Number: 07/24/2022, 2:03 PM  Clinical Narrative:                 CSW acknowledges consult for SNF placement. CSW met with pt at bedside to discuss dc plans. Pt states she does not want to go to a facility and asks if she can do therapy at home. CSW reviewed HH with pt and she is agreeable. Pt states she lives with her sister who will be able to help her at DC. Pt notes she has Canes and a shower chair at home. PT has not made recommendations for DME yet, will order at discharge. CSW consulted [facility] needs, they are reviewing. Pt declines CSW calling sister as she is actively updating her. Pt states sister will take her home at DC, and she has someone to help her into the house. TOC will continue to follow for DC needs.   2:25 [facility] has accepted for [facility] PT/OT. MD advised pt may DC tomorrow. DME will need to be ordered after recs are made.   2:50 DME recommended is wheelchair, BSC, and Rolling walker. PT states pt will need EMS transport home. CSW confirmed with pt that she is agreeable to EMS and DME. Confirmed that address in the chart is accurate. Requests DME be delivered to the house. Requested orders and will request equipment when able. Expected Discharge Plan: [place] Barriers to Discharge: Continued Medical Work up, Barriers Unresolved (comment) ([place] pending)   Patient Goals and CMS Choice Patient states their goals for this hospitalization and ongoing recovery are:: Pt would like to go home. CMS Medicare.gov Compare Post Acute Care list provided to:: Patient Choice offered to / list presented to : Patient  Expected Discharge Plan and Services Expected Discharge Plan: [place] Choice: [place]  arrangements for the past 2 months: Single Family Home                           HH Arranged: PT, OT          Prior Living Arrangements/Services Living arrangements for the past 2 months: Single Family Home Lives with:: Siblings Patient language and need for interpreter reviewed:: Yes Do you feel safe going back to the place where you live?: Yes      Need for Family Participation in Patient Care: Yes (Comment) Care giver support system in place?: Yes (comment)   Criminal Activity/Legal Involvement Pertinent to Current Situation/Hospitalization: No - Comment as needed  Activities of Daily Living Home Assistive Devices/Equipment: Eyeglasses ADL Screening (condition at time of admission) Patient's cognitive ability adequate to safely complete daily activities?: Yes Is the patient deaf or have difficulty hearing?: No Does the patient have difficulty seeing, even when wearing glasses/contacts?: No Does the patient have difficulty concentrating, remembering, or making decisions?: No Patient able to express need for assistance with ADLs?: Yes Does the patient have difficulty dressing or bathing?: No Independently performs ADLs?: Yes (appropriate for developmental age) Does the patient have difficulty walking or climbing stairs?: Yes Weakness of Legs: Both Weakness of Arms/Hands: None  Permission Sought/Granted Permission sought to share information with : Family Supports Permission granted to share information  with : No              Emotional Assessment Appearance:: Appears stated age Attitude/Demeanor/Rapport: Engaged Affect (typically observed): Appropriate Orientation: : Oriented to Self, Oriented to Place, Oriented to  Time, Oriented to Situation Alcohol / Substance Use: Not Applicable Psych Involvement: No (comment)  Admission diagnosis:  Leg weakness, bilateral [R29.898] Weakness of both lower extremities [R29.898] Impaired ambulation [R26.2] Patient Active  Problem List   Diagnosis Date Noted   Leg weakness, bilateral 07/24/2022   Nausea & vomiting 07/24/2022   Burning reflux 07/24/2022   Hypercalcemia 07/24/2022   Hypothyroidism 07/24/2022   DM II (diabetes mellitus, type II), controlled ([place]) 10/25/2018   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

## 2022-07-24 NOTE — Assessment & Plan Note (Signed)
Related to GERD/ suspect underlying esophagitis.  Switch intravenous Protonix to oral

## 2022-07-24 NOTE — Assessment & Plan Note (Addendum)
IV PPI.  Advised pt to refrain from goody/ [facility] / NSAID.

## 2022-07-24 NOTE — Evaluation (Signed)
Occupational Therapy Evaluation Patient Details Name: Sara Pittman MRN: [id number] DOB: 10/09/1951 Today's Date: 07/24/2022   History of Present Illness Pt is a 71 y/o F admitted on 07/23/22 after presenting with c/o BLE weakness for the past few days, falling twice, as well as nausea. Pt's BLE weakness is suspected 2/2 electrolyte abnormality. PMH: DM, GERD, heart murmur, HTN   Clinical Impression   Sara Pittman was seen for OT evaluation this date. Prior to hospital admission, pt was MOD I using SPC for mobility and ADLs. Pt lives alone in home c 4 STE. Pt presents to acute OT demonstrating impaired ADL performance and functional mobility 2/2 decreased activity tolerance and functional strength/balance deficits. Pt currently requires SUPERVISION don B socks seated EOB. MIN A sit<>stand at EOB, BLE shaking in standing. Improved with RW use (requires BUE on bed instead of RW), MIN A + RW for SPT bed>chair. Pt would benefit from skilled OT to address noted impairments and functional limitations (see below for any additional details). Upon hospital discharge, recommend STR to maximize pt safety and return to PLOF.    Recommendations for follow up therapy are one component of a multi-disciplinary discharge planning process, led by the attending physician.  Recommendations may be updated based on patient status, additional functional criteria and insurance authorization.   Follow Up Recommendations  Skilled nursing-short term rehab (<3 hours/day)    Assistance Recommended at Discharge Intermittent Supervision/Assistance  Patient can return home with the following A lot of help with walking and/or transfers;A lot of help with bathing/dressing/bathroom    Functional Status Assessment  Patient has had a recent decline in their functional status and demonstrates the ability to make significant improvements in function in a reasonable and predictable amount of time.  Equipment Recommendations   BSC/3in1    Recommendations for Other Services       Precautions / Restrictions Precautions Precautions: Fall Restrictions Weight Bearing Restrictions: No      Mobility Bed Mobility Overal bed mobility: Needs Assistance Bed Mobility: Supine to Sit     Supine to sit: Min guard          Transfers Overall transfer level: Needs assistance Equipment used: 1 person hand held assist, Rolling walker (2 wheels) Transfers: Sit to/from Stand, Bed to chair/wheelchair/BSC Sit to Stand: Min assist     Step pivot transfers: Min assist            Balance Overall balance assessment: Needs assistance Sitting-balance support: Feet supported, No upper extremity supported Sitting balance-Leahy Scale: Good     Standing balance support: Bilateral upper extremity supported, Reliant on assistive device for balance Standing balance-Leahy Scale: Poor                             ADL either performed or assessed with clinical judgement   ADL Overall ADL's : Needs assistance/impaired                                       General ADL Comments: SUPERVISION don B socks seated EOB. MIN A + RW for simulated BSC t/f. Requires BUE support standing, poor tolerance, unable to trial standing grooming tasks      Pertinent Vitals/Pain Pain Assessment Pain Assessment: No/denies pain     Hand Dominance     Extremity/Trunk Assessment Upper Extremity Assessment Upper Extremity Assessment: Generalized weakness  Lower Extremity Assessment Lower Extremity Assessment: Generalized weakness RLE Deficits / Details: 3-/5 BLE knee extension, 3-/5 BLE hip flexion in sitting, pt endorses decreased sensation to light touch in BLE feet, proprioception intact LLE Deficits / Details: 3-/5 BLE knee extension, 3-/5 BLE hip flexion in sitting, pt endorses decreased sensation to light touch in BLE feet, proprioception intact       Communication Communication Communication: No  difficulties   Cognition Arousal/Alertness: Awake/alert Behavior During Therapy: WFL for tasks assessed/performed, Flat affect Overall Cognitive Status: Within Functional Limits for tasks assessed                                        Home Living Family/patient expects to be discharged to:: Private residence Living Arrangements: Alone Available Help at Discharge: Family Type of Home: House Home Access: Stairs to enter Technical brewer of Steps: 4 Entrance Stairs-Rails: Right;Left (wideset) Home Layout: One level               Home Equipment: Cane - single point          Prior Functioning/Environment Prior Level of Function : Independent/Modified Independent             Mobility Comments: Pt reports she's ambulatory with SPC but endorses 2 falls in the past week. Was driving. ADLs Comments: Independent.        OT Problem List: Decreased strength;Decreased activity tolerance;Impaired balance (sitting and/or standing);Decreased safety awareness      OT Treatment/Interventions: Self-care/ADL training;Therapeutic exercise;Energy conservation;DME and/or AE instruction;Therapeutic activities;Patient/family education;Balance training    OT Goals(Current goals can be found in the care plan section) Acute Rehab OT Goals Patient Stated Goal: to get stronger OT Goal Formulation: With patient Time For Goal Achievement: 08/07/22 Potential to Achieve Goals: Good ADL Goals Pt Will Perform Grooming: with modified independence;standing Pt Will Perform Lower Body Dressing: with modified independence;sit to/from stand Pt Will Transfer to Toilet: ambulating;regular height toilet;with supervision  OT Frequency: Min 2X/week    Co-evaluation              AM-PAC OT "6 Clicks" Daily Activity     Outcome Measure Help from another person eating meals?: None Help from another person taking care of personal grooming?: A Little Help from another person  toileting, which includes using toliet, bedpan, or urinal?: A Lot Help from another person bathing (including washing, rinsing, drying)?: A Lot Help from another person to put on and taking off regular upper body clothing?: A Little Help from another person to put on and taking off regular lower body clothing?: A Little 6 Click Score: 17   End of Session Equipment Utilized During Treatment: Rolling walker (2 wheels) Nurse Communication: Mobility status  Activity Tolerance: Patient tolerated treatment well Patient left: in chair;with call bell/phone within reach;with chair alarm set;with nursing/sitter in room  OT Visit Diagnosis: Repeated falls (R29.6);Muscle weakness (generalized) (M62.81)                Time: 0923-3007 OT Time Calculation (min): 15 min Charges:  OT General Charges $OT Visit: 1 Visit OT Evaluation $OT Eval Moderate Complexity: 1 Mod  Dessie Coma, M.S. OTR/L  07/24/22, 12:31 PM  ascom [phone]

## 2022-07-24 NOTE — Final Progress Note (Signed)
Same day rounding progress note  Patient seen and examined while in the ED.  Please see Dr. Serita Grit dictated history and physical for further details.  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  She is not interested in continuing immunotherapy anymore.  I have requested her to discuss that with her PCP and oncology  Time spent: 35 minutes

## 2022-07-24 NOTE — Assessment & Plan Note (Signed)
Suspect this is multifactorial Mag was low, replaced cpk normal.  Lactic normal. B12 level -quite high (7416), I am not sure if this is false positive considering she was given IV vitamin B12 on admission and if B12 was collected right after that this could be falsely elevated.  I will recheck Folate normal MRI lumbar spine WNL. Vitamin D is low at 24.36 TSH is high at 24.62 and his dose of levothyroxine from 75 to 100 mcg/day

## 2022-07-24 NOTE — H&P (Signed)
History and Physical    Chief Complaint: Leg weakness.    HISTORY OF PRESENT ILLNESS: Sara Pittman is an 71 y.o. female  for leg weakness for past few days. Pt has been having nausea for past few days. Pt has been having n/v  and burning in chest. Pt does not report any other symptoms or complaints.    Pt has PMH as below: Past Medical History:  Diagnosis Date   Diabetes mellitus without complication (HCC)    Edema    feet/leg   GERD (gastroesophageal reflux disease)    Heart murmur    Hypertension    Hypothyroidism    Review Of Systems: Review of Systems  Neurological:  Positive for weakness.  All other systems reviewed and are negative.   ALLERGIES: Allergies  Allergen Reactions   Crestor [Rosuvastatin] Other (See Comments)    All over aches     PAST SURGICAL HISTORY: Past Surgical History:  Procedure Laterality Date   ABDOMINAL HYSTERECTOMY     AMPUTATION     tip of finger   BIOPSY THYROID     CATARACT EXTRACTION W/PHACO Right 02/16/2017   Procedure: CATARACT EXTRACTION PHACO AND INTRAOCULAR LENS PLACEMENT (IOC);  Surgeon: Birder Robson, MD;  Location: [facility] ORS;  Service: Ophthalmology;  Laterality: Right;  [place] 52.5 AP% 17.7 CDE 9.28 FLUID PACK LOT # [id number]   CATARACT EXTRACTION W/PHACO Left 03/09/2017   Procedure: CATARACT EXTRACTION PHACO AND INTRAOCULAR LENS PLACEMENT (IOC);  Surgeon: Birder Robson, MD;  Location: [facility] ORS;  Service: Ophthalmology;  Laterality: Left;  [place] 1:02.2 AP% 19.8 CDE 12.30 Fluid Pack Lot # [id number]     SOCIAL HISTORY: Social History   Socioeconomic History   Marital status: Single    Spouse name: Not on file   Number of children: Not on file   Years of education: Not on file   Highest education level: Not on file  Occupational History   Not on file  Tobacco Use   Smoking status: Every Day    Packs/day: 0.50    Types: Cigarettes   Smokeless tobacco: Never  Vaping Use   Vaping Use: Never used   Substance and Sexual Activity   Alcohol use: No   Drug use: No   Sexual activity: Yes    Birth control/protection: Post-menopausal, Surgical  Other Topics Concern   Not on file  Social History Narrative   Not on file   Social Determinants of Health   Financial Resource Strain: Not on file  Food Insecurity: Not on file  Transportation Needs: Not on file  Physical Activity: Not on file  Stress: Not on file  Social Connections: Not on file      CURRENT MEDS:  Current Facility-Administered Medications (Endocrine & Metabolic):    levothyroxine (SYNTHROID) tablet 75 mcg  Current Outpatient Medications (Endocrine & Metabolic):    Dulaglutide 1.5 MG/0.5ML SOPN, Inject into the skin.   glipiZIDE (GLUCOTROL) 10 MG tablet, Take 10 mg by mouth 2 (two) times daily before a meal.   levothyroxine (SYNTHROID) 75 MCG tablet, Take 75 mcg by mouth daily.   metFORMIN (GLUCOPHAGE) 1000 MG tablet, Take 1,000 mg by mouth 2 (two) times daily with a meal.  Current Facility-Administered Medications (Cardiovascular):    hydrALAZINE (APRESOLINE) injection 10 mg   losartan (COZAAR) tablet 100 mg   metoprolol succinate (TOPROL-XL) 24 hr tablet 50 mg  Current Outpatient Medications (Cardiovascular):    amLODipine (NORVASC) 2.5 MG tablet, Take 2.5 mg by mouth daily.   losartan (  COZAAR) 100 MG tablet, Take 100 mg by mouth daily.   metoprolol succinate (TOPROL-XL) 50 MG 24 hr tablet, Take 1 tablet by mouth daily.   simvastatin (ZOCOR) 80 MG tablet, Take 80 mg by mouth at bedtime.    Current Facility-Administered Medications (Analgesics):    acetaminophen (TYLENOL) tablet 650 mg **OR** acetaminophen (TYLENOL) suppository 650 mg   aspirin EC tablet 81 mg   morphine (PF) 2 MG/ML injection 2 mg  Current Outpatient Medications (Analgesics):    HYDROcodone-acetaminophen (NORCO/VICODIN) 5-325 MG tablet, Take 1 tablet by mouth every 8 (eight) hours as needed for pain.   traMADol (ULTRAM) 50 MG tablet,  Take 50 mg by mouth every 6 (six) hours as needed for pain.   aspirin EC 81 MG tablet, Take 81 mg by mouth daily. (Patient not taking: Reported on 07/24/2022)  Current Facility-Administered Medications (Hematological):    cyanocobalamin (VITAMIN B12) injection 1,000 mcg   Current Facility-Administered Medications (Other):    lactated ringers infusion   ondansetron (ZOFRAN) tablet 4 mg **OR** ondansetron (ZOFRAN) injection 4 mg   topiramate (TOPAMAX) tablet 25 mg  Current Outpatient Medications (Other):    omeprazole (PRILOSEC) 20 MG capsule, Take 20 mg by mouth 2 (two) times daily before a meal.   cholecalciferol (VITAMIN D) 1000 units tablet, Take 1,000 Units by mouth daily.   topiramate (TOPAMAX) 25 MG tablet, Take by mouth.    ED Course: Pt in Ed is alert and oriented.  BP is elevated but o2 sats are wnl.  Vitals:   07/23/22 1800 07/23/22 1830 07/23/22 2200 07/24/22 0115  BP: (!) 189/109 (!) 181/102 (!) 148/101 (!) 152/91  Pulse: (!) 101 94 93 94  Resp: (!) 43 (!) '22 19 18  '$ Temp:  98.1 F (36.7 C)  98 F (36.7 C)  TempSrc:  Oral    SpO2: 97% 96% 97% 100%  Weight:      Height:       No intake/output data recorded. SpO2: 100 % Blood work in ed shows glucose of 153, hypercalcemia with a calcium of 11.3, anion gap of 20, normal LFTs otherwise except for total bili of 1.9.  Troponins of 18 and 25, CBC within normal limits, white count of 7.6 hemoglobin of 14.8 and platelets of 384.  Results for orders placed or performed during the hospital encounter of 07/23/22 (from the past 24 hour(s))  CBG monitoring, ED     Status: Abnormal   Collection Time: 07/23/22 12:04 PM  Result Value Ref Range   Glucose-Capillary 153 (H) 70 - 99 mg/dL  Resp Panel by RT-PCR (Flu A&B, Covid) Anterior Nasal Swab     Status: None   Collection Time: 07/23/22 12:06 PM   Specimen: Anterior Nasal Swab  Result Value Ref Range   SARS Coronavirus 2 by RT PCR NEGATIVE NEGATIVE   Influenza A by PCR  NEGATIVE NEGATIVE   Influenza B by PCR NEGATIVE NEGATIVE  Protime-INR     Status: None   Collection Time: 07/23/22  1:03 PM  Result Value Ref Range   Prothrombin Time 13.1 11.4 - 15.2 seconds   INR 1.0 0.8 - 1.2  APTT     Status: None   Collection Time: 07/23/22  1:03 PM  Result Value Ref Range   aPTT 24 24 - 36 seconds  CBC     Status: Abnormal   Collection Time: 07/23/22  1:03 PM  Result Value Ref Range   WBC 7.6 4.0 - 10.5 K/uL   RBC 5.34 (H)  3.87 - 5.11 MIL/uL   Hemoglobin 14.8 12.0 - 15.0 g/dL   HCT 44.7 36.0 - 46.0 %   MCV 83.7 80.0 - 100.0 fL   MCH 27.7 26.0 - 34.0 pg   MCHC 33.1 30.0 - 36.0 g/dL   RDW 14.7 11.5 - 15.5 %   Platelets 384 150 - 400 K/uL   nRBC 0.0 0.0 - 0.2 %  Differential     Status: None   Collection Time: 07/23/22  1:03 PM  Result Value Ref Range   Neutrophils Relative % 82 %   Neutro Abs 6.2 1.7 - 7.7 K/uL   Lymphocytes Relative 13 %   Lymphs Abs 1.0 0.7 - 4.0 K/uL   Monocytes Relative 4 %   Monocytes Absolute 0.3 0.1 - 1.0 K/uL   Eosinophils Relative 0 %   Eosinophils Absolute 0.0 0.0 - 0.5 K/uL   Basophils Relative 1 %   Basophils Absolute 0.0 0.0 - 0.1 K/uL   Immature Granulocytes 0 %   Abs Immature Granulocytes 0.03 0.00 - 0.07 K/uL  Comprehensive metabolic panel     Status: Abnormal   Collection Time: 07/23/22  1:03 PM  Result Value Ref Range   Sodium 137 135 - 145 mmol/L   Potassium 3.7 3.5 - 5.1 mmol/L   Chloride 98 98 - 111 mmol/L   CO2 19 (L) 22 - 32 mmol/L   Glucose, Bld 142 (H) 70 - 99 mg/dL   BUN 12 8 - 23 mg/dL   Creatinine, Ser 0.89 0.44 - 1.00 mg/dL   Calcium 11.3 (H) 8.9 - 10.3 mg/dL   Total Protein 8.1 6.5 - 8.1 g/dL   Albumin 4.7 3.5 - 5.0 g/dL   AST 20 15 - 41 U/L   ALT 13 0 - 44 U/L   Alkaline Phosphatase 51 38 - 126 U/L   Total Bilirubin 1.9 (H) 0.3 - 1.2 mg/dL   GFR, Estimated >60 >60 mL/min   Anion gap 20 (H) 5 - 15  Troponin I (High Sensitivity)     Status: Abnormal   Collection Time: 07/23/22  1:03 PM   Result Value Ref Range   Troponin I (High Sensitivity) 18 (H) <18 ng/L  Troponin I (High Sensitivity)     Status: Abnormal   Collection Time: 07/23/22  7:04 PM  Result Value Ref Range   Troponin I (High Sensitivity) 25 (H) <18 ng/L  Lactic acid, plasma     Status: None   Collection Time: 07/23/22  7:04 PM  Result Value Ref Range   Lactic Acid, Venous 1.6 0.5 - 1.9 mmol/L  Lactic acid, plasma     Status: None   Collection Time: 07/23/22  8:57 PM  Result Value Ref Range   Lactic Acid, Venous 1.3 0.5 - 1.9 mmol/L   In Ed pt received the following: Meds ordered this encounter  Medications   ondansetron (ZOFRAN) 4 MG/2ML injection    [place], [place] P: cabinet override   ondansetron (ZOFRAN) injection 4 mg   DISCONTD: 0.9 %  sodium chloride infusion   lactated ringers bolus 1,000 mL   hydrALAZINE (APRESOLINE) injection 10 mg   cyanocobalamin (VITAMIN B12) injection 1,000 mcg   aspirin EC tablet 81 mg   levothyroxine (SYNTHROID) tablet 75 mcg   losartan (COZAAR) tablet 100 mg   metoprolol succinate (TOPROL-XL) 24 hr tablet 50 mg   topiramate (TOPAMAX) tablet 25 mg   lactated ringers infusion   OR Linked Order Group    acetaminophen (TYLENOL) tablet 650 mg  acetaminophen (TYLENOL) suppository 650 mg   morphine (PF) 2 MG/ML injection 2 mg   OR Linked Order Group    ondansetron (ZOFRAN) tablet 4 mg    ondansetron (ZOFRAN) injection 4 mg    Unresulted Labs (From admission, onward)     Start     Ordered   07/24/22 0124  TSH  Once,   URGENT        07/24/22 0126   07/24/22 0124  T4, free  Once,   URGENT        07/24/22 0126   07/24/22 0124  Type and screen  Once,   STAT        07/24/22 0126   07/24/22 0123  Lipase, blood  Once,   STAT        07/24/22 0126   07/24/22 0120  Vitamin B12  Add-on,   AD        07/24/22 0120   07/24/22 0011  Magnesium  Add-on,   AD        07/24/22 0010   07/24/22 0011  CK  Add-on,   AD        07/24/22 0010   07/24/22 0011  Phosphorus  Once,    STAT        07/24/22 0010   07/23/22 2100  Lactic acid, plasma  Now then every 2 hours,   STAT      07/23/22 1913   07/23/22 1204  Urine Drug Screen, Qualitative  Once,   URGENT        07/23/22 1205   07/23/22 1204  Urinalysis, Routine w reflex microscopic  Once,   URGENT        07/23/22 1205   Unscheduled  Occult blood card to lab, stool  As needed,   URGENT      07/24/22 0126           Admission Imaging : MR LUMBAR SPINE WO CONTRAST  Result Date: 07/23/2022 CLINICAL DATA:  Low back pain, leg weakness EXAM: MRI LUMBAR SPINE WITHOUT CONTRAST TECHNIQUE: Multiplanar, multisequence MR imaging of the lumbar spine was performed. No intravenous contrast was administered. COMPARISON:  None Available. FINDINGS: Segmentation:  Standard. Alignment:  Mild dextrocurvature.  No listhesis. Vertebrae:  No acute fracture or suspicious osseous lesion. Conus medullaris and cauda equina: Conus extends to the T12-L1 level. Conus and cauda equina appear normal. Paraspinal and other soft tissues: Negative. Disc levels: T12-L1: No significant disc bulge. No spinal canal stenosis or neural foraminal narrowing. L1-L2: Disc desiccation and minimal disc bulge. Mild facet arthropathy. No spinal canal stenosis or neural foraminal narrowing. L2-L3: No significant disc bulge. Mild facet arthropathy. No spinal canal stenosis or neural foraminal narrowing. L3-L4: Disc desiccation and minimal disc bulge. Mild facet arthropathy. No spinal canal stenosis or neural foraminal narrowing. L4-L5: No significant disc bulge. Mild facet arthropathy. No spinal canal stenosis or neural foraminal narrowing. L5-S1: Disc desiccation and small central disc protrusion. Mild-to-moderate facet arthropathy. No spinal canal stenosis or neural foraminal narrowing. IMPRESSION: 1. No spinal canal stenosis or neural foraminal narrowing. 2. Multilevel facet arthropathy, which can be a cause of back pain. Electronically Signed   By: Merilyn Baba M.D.    On: 07/23/2022 23:33   CT HEAD WO CONTRAST (5MM)  Result Date: 07/23/2022 CLINICAL DATA:  Numbness in legs and left-sided facial. EXAM: CT HEAD WITHOUT CONTRAST TECHNIQUE: Contiguous axial images were obtained from the base of the skull through the vertex without intravenous contrast. RADIATION DOSE REDUCTION: This exam  was performed according to the departmental dose-optimization program which includes automated exposure control, adjustment of the mA and/or kV according to patient size and/or use of iterative reconstruction technique. COMPARISON:  None Available. FINDINGS: Brain: The ventricles are in the midline without mass effect or shift. They are normal in size and configuration. Mild age related cerebral atrophy but no significant periventricular white matter changes. No CT findings for acute hemispheric infarction or intracranial hemorrhage. No mass lesions. The brainstem and cerebellum unremarkable. Vascular: Vascular calcifications but no aneurysm or hyperdense vessels. Skull: No skull fracture or bone lesions. Sinuses/Orbits: The paranasal sinuses and mastoid air cells are clear. The globes are intact. Other: No scalp lesions or scalp hematoma. IMPRESSION: Mild age related cerebral atrophy but no acute intracranial findings or mass lesions. Electronically Signed   By: Marijo Sanes M.D.   On: 07/23/2022 12:41      Physical Examination: Vitals:   07/23/22 1800 07/23/22 1830 07/23/22 2200 07/24/22 0115  BP: (!) 189/109 (!) 181/102 (!) 148/101 (!) 152/91  Pulse: (!) 101 94 93 94  Temp:  98.1 F (36.7 C)  98 F (36.7 C)  Resp: (!) 43 (!) '22 19 18  '$ Height:      Weight:      SpO2: 97% 96% 97% 100%  TempSrc:  Oral    BMI (Calculated):       Physical Exam Vitals and nursing note reviewed.  Constitutional:      General: She is not in acute distress.    Appearance: Normal appearance. She is not ill-appearing, toxic-appearing or diaphoretic.  HENT:     Head: Normocephalic and atraumatic.      Right Ear: Hearing and external ear normal.     Left Ear: Hearing and external ear normal.     Nose: Nose normal. No nasal deformity.     Mouth/Throat:     Lips: Pink.     Mouth: Mucous membranes are moist.     Tongue: No lesions.     Pharynx: Oropharynx is clear.  Eyes:     Extraocular Movements: Extraocular movements intact.     Pupils: Pupils are equal, round, and reactive to light.  Neck:     Vascular: No carotid bruit.  Cardiovascular:     Rate and Rhythm: Normal rate and regular rhythm.     Pulses: Normal pulses.     Heart sounds: Normal heart sounds.  Pulmonary:     Effort: Pulmonary effort is normal.     Breath sounds: Normal breath sounds.  Abdominal:     General: Bowel sounds are normal. There is no distension.     Palpations: Abdomen is soft. There is no mass.     Tenderness: There is no abdominal tenderness. There is no guarding.     Hernia: No hernia is present.  Musculoskeletal:     Right lower leg: No edema.     Left lower leg: No edema.  Skin:    General: Skin is warm.  Neurological:     General: No focal deficit present.     Mental Status: She is alert and oriented to person, place, and time.     Cranial Nerves: Cranial nerves 2-12 are intact.     Motor: Motor function is intact.  Psychiatric:        Attention and Perception: Attention normal.        Mood and Affect: Mood normal.        Speech: Speech normal.  Behavior: Behavior normal. Behavior is cooperative.        Cognition and Memory: Cognition normal.      Unresulted Labs (From admission, onward)     Start     Ordered   07/24/22 0124  TSH  Once,   URGENT        07/24/22 0126   07/24/22 0124  T4, free  Once,   URGENT        07/24/22 0126   07/24/22 0124  Type and screen  Once,   STAT        07/24/22 0126   07/24/22 0123  Lipase, blood  Once,   STAT        07/24/22 0126   07/24/22 0120  Vitamin B12  Add-on,   AD        07/24/22 0120   07/24/22 0011  Magnesium  Add-on,   AD         07/24/22 0010   07/24/22 0011  CK  Add-on,   AD        07/24/22 0010   07/24/22 0011  Phosphorus  Once,   STAT        07/24/22 0010   07/23/22 2100  Lactic acid, plasma  Now then every 2 hours,   STAT      07/23/22 1913   07/23/22 1204  Urine Drug Screen, Qualitative  Once,   URGENT        07/23/22 1205   07/23/22 1204  Urinalysis, Routine w reflex microscopic  Once,   URGENT        07/23/22 1205   Unscheduled  Occult blood card to lab, stool  As needed,   URGENT      07/24/22 0126             Assessment and Plan: * Leg weakness, bilateral Suspect related to electrolyte abnormality. Mag pending.  cpk pending.  Lactic normal. b12 level pending. MRI lumbar spine WNL.   Hypothyroidism Continue levothyroxine at 75 mcg.   Hypercalcemia LR bolus and MIVF.   Burning reflux IV PPI.  Advised pt to refrain from goody/ [facility] / NSAID.    Nausea & vomiting Related to GERD/ suspect underlying esophagitis.  IV PPI.  DM II (diabetes mellitus, type II), controlled ([place]) Glycemic protocol.    DVT prophylaxis:  Heparin    Code Status:  Full Code    Family Communication:  WATKINS,VERNELL (Sister)  [phone] (Home Phone)   Disposition Plan:  Home    Consults called:  None   Admission status: Observation.   Unit/ Expected LOS: Med tele.    Para Skeans MD [facility]  6 PM- 2 AM. Please contact me via secure Chat 6 PM-2 AM. [phone] ( Pager ) To contact the [facility] Attending or Consulting provider [place] or covering provider during after hours [place], for this patient.   Check the care team in [facility] and look for a) attending/consulting [facility] provider listed and b) the [facility] team listed Log into [email or web address] and use [place]'s universal password to access. If you do not have the password, please contact the hospital operator. Locate the [facility] provider you are looking for under [facility] and page to a number that you can be directly  reached. If you still have difficulty reaching the provider, please page the [facility] (Director on Call) for the Hospitalists listed on amion for assistance. [email or web address] 07/24/2022, 1:46 AM

## 2022-07-24 NOTE — Assessment & Plan Note (Signed)
LR bolus and MIVF.

## 2022-07-24 NOTE — Assessment & Plan Note (Signed)
Well-controlled for now 

## 2022-07-24 NOTE — Evaluation (Addendum)
Physical Therapy Evaluation Patient Details Name: Sara Pittman MRN: [id number] DOB: 08/16/1951 Today's Date: 07/24/2022  History of Present Illness  Pt is a 71 y/o F admitted on 07/23/22 after presenting with c/o BLE weakness for the past few days, falling twice, as well as nausea. Pt's BLE weakness is suspected 2/2 electrolyte abnormality. PMH: DM, GERD, heart murmur, HTN  Clinical Impression  Pt seen for PT evaluation with pt reporting prior to admission she was independent with mobility & ADLs with SPC. Pt endorses decreased sensation in BLE & 2 falls this past week. On this date, pt demonstrates BLE weakness (proximal more weak than distal BLE) & decreased sensation to light touch in BLE feet. Pt requires MAX assist for STS from recliner with use of RW. Unsafe to attempt gait 2/2 significant proximal BLE weakness but pt engaged in glute squeezes in standing. Pt reports her BLE ache after standing attempt. Pt engaged in BLE strengthening exercises & educated on need to perform them throughout the day. Pt would benefit from STR upon d/c to maximize independence with functional mobility & reduce fall risk prior to return home. Will continue to follow pt acutely to address BLE strengthening, transfers, and gait with LRAD.      Recommendations for follow up therapy are one component of a multi-disciplinary discharge planning process, led by the attending physician.  Recommendations may be updated based on patient status, additional functional criteria and insurance authorization.  Follow Up Recommendations Skilled nursing-short term rehab (<3 hours/day) Can patient physically be transported by private vehicle: No    Assistance Recommended at Discharge Frequent or constant Supervision/Assistance  Patient can return home with the following  A lot of help with walking and/or transfers;A lot of help with bathing/dressing/bathroom;Help with stairs or ramp for entrance;Assist for  transportation;Assistance with cooking/housework    Equipment Recommendations W/c with cushion, BSC, RW, non emergent EMS transport home  Recommendations for Other Services       Functional Status Assessment Patient has had a recent decline in their functional status and demonstrates the ability to make significant improvements in function in a reasonable and predictable amount of time.     Precautions / Restrictions Precautions Precautions: Fall Restrictions Weight Bearing Restrictions: No      Mobility  Bed Mobility               General bed mobility comments: pt received & left sitting in recliner    Transfers Overall transfer level: Needs assistance Equipment used: Rolling walker (2 wheels) Transfers: Sit to/from Stand Sit to Stand: Max assist           General transfer comment: Pt with safe hand placement, pushing to stand. Pt with weak glutes/hip extensors & with great difficulty powering up to standing.    Ambulation/Gait Ambulation/Gait assistance:  (not safe to attempt)                Stairs            Wheelchair Mobility    Modified Rankin (Stroke Patients Only)       Balance Overall balance assessment: Needs assistance Sitting-balance support: Feet supported, Bilateral upper extremity supported Sitting balance-Leahy Scale: Good     Standing balance support: During functional activity, Bilateral upper extremity supported, Reliant on assistive device for balance Standing balance-Leahy Scale: Fair  Pertinent Vitals/Pain Pain Assessment Pain Assessment: Faces Faces Pain Scale: Hurts a little bit Pain Location: slight BLE aching after standing attempt Pain Descriptors / Indicators: Aching Pain Intervention(s): Monitored during session, Repositioned    Home Living Family/patient expects to be discharged to:: Private residence Living Arrangements: Alone Available Help at Discharge:  Family;Available 24 hours/day Type of Home: House Home Access: Stairs to enter Entrance Stairs-Rails: Right;Left (wideset) Entrance Stairs-Number of Steps: 4   Home Layout: One level Home Equipment: Cane - single point      Prior Function Prior Level of Function : Independent/Modified Independent             Mobility Comments: Pt reports she's ambulatory with SPC but endorses 2 falls in the past week. Was driving. ADLs Comments: Independent.     Hand Dominance        Extremity/Trunk Assessment   Upper Extremity Assessment Upper Extremity Assessment: Generalized weakness    Lower Extremity Assessment Lower Extremity Assessment: Generalized weakness;RLE deficits/detail;LLE deficits/detail RLE Deficits / Details: 3-/5 BLE knee extension, 3-/5 BLE hip flexion in sitting, pt endorses decreased sensation to light touch in BLE feet, proprioception intact LLE Deficits / Details: 3-/5 BLE knee extension, 3-/5 BLE hip flexion in sitting, pt endorses decreased sensation to light touch in BLE feet, proprioception intact       Communication   Communication: No difficulties  Cognition Arousal/Alertness: Awake/alert Behavior During Therapy: WFL for tasks assessed/performed, Flat affect Overall Cognitive Status: Within Functional Limits for tasks assessed                                          General Comments      Exercises General Exercises - Lower Extremity Gluteal Sets: AROM, Strengthening, 10 reps, Seated Long Arc Quad: AROM, Strengthening, Both, 10 reps, Seated Hip Flexion/Marching: AROM, Strengthening, Seated, Both, 10 reps   Assessment/Plan    PT Assessment Patient needs continued PT services  PT Problem List Decreased strength;Pain;Decreased range of motion;Decreased balance;Decreased mobility;Decreased knowledge of precautions;Decreased knowledge of use of DME;Decreased activity tolerance;Impaired sensation       PT Treatment Interventions  DME instruction;Therapeutic exercise;Gait training;Stair training;Neuromuscular re-education;Balance training;Functional mobility training;Patient/family education;Therapeutic activities    PT Goals (Current goals can be found in the Care Plan section)  Acute Rehab PT Goals Patient Stated Goal: get better, get the feeling back in her legs PT Goal Formulation: With patient Time For Goal Achievement: 08/07/22 Potential to Achieve Goals: Good    Frequency Min 2X/week     Co-evaluation               AM-PAC PT "6 Clicks" Mobility  Outcome Measure Help needed turning from your back to your side while in a flat bed without using bedrails?: A Little Help needed moving from lying on your back to sitting on the side of a flat bed without using bedrails?: A Lot Help needed moving to and from a bed to a chair (including a wheelchair)?: Total Help needed standing up from a chair using your arms (e.g., wheelchair or bedside chair)?: Total Help needed to walk in hospital room?: Total Help needed climbing 3-5 steps with a railing? : Total 6 Click Score: 9    End of Session   Activity Tolerance: Patient tolerated treatment well Patient left: with chair alarm set;in chair;with call bell/phone within reach Nurse Communication: Mobility status (telemetry not working properly despite  being hooked up) PT Visit Diagnosis: Muscle weakness (generalized) (M62.81);Other abnormalities of gait and mobility (R26.89);Difficulty in walking, not elsewhere classified (R26.2);Unsteadiness on feet (R26.81)    Time: 5277-8242 PT Time Calculation (min) (ACUTE ONLY): 15 min   Charges:   PT Evaluation $PT Eval Moderate Complexity: 1 Mod PT Treatments $Therapeutic Activity: 8-22 mins        Lavone Nian, PT, DPT 07/24/22, 2:29 PM   Waunita Schooner 07/24/2022, 12:05 PM

## 2022-07-24 NOTE — Progress Notes (Signed)
Initial Nutrition Assessment  DOCUMENTATION CODES:   Not applicable  INTERVENTION:   -Boost Breeze po TID, each supplement provides 250 kcal and 9 grams of protein  -MVI with minerals daily -RD will follow for diet advancement and adjust supplement regimen as appropriate -RD to check labs and replete as necessary for potential micronutrient deficiencies secondary to weakness and myeloneuropathy: vitamin B-12, copper, folate, vitamin E, vitamin D, vitamin C, vitamin B-6, thiamine   NUTRITION DIAGNOSIS:   Inadequate oral intake related to poor appetite as evidenced by per patient/family report.  GOAL:   Patient will meet greater than or equal to 90% of their needs  MONITOR:   Supplement acceptance, PO intake, Diet advancement  REASON FOR ASSESSMENT:   Malnutrition Screening Tool    ASSESSMENT:   Pt with PMH of DM, GERD, HTN, and hypothyroidsim admitted for leg weakness. Pt has been having nausea for past few days.  Pt admitted with bilateral leg weakness.   Reviewed I/O's: +120 ml x 24 hours   Spoke with pt, who was sitting in recliner chair at time of visit. She reports feeling a little better and was able to keep down broth and jello without difficulty. PTA, pt reports inability to keep foods and liquids down since last week. Pt explains that her intake has been erratic since May, as that is when she started on immunotherapy treatments. Pt goes for treatments every 3 weeks and feels poorly (nausea, inability to keep foods and liquids down) for the first two weeks after treatment, however, appetite returns after the third week. Pt shares that when she is feeling well she eats "whatever I want" including salads, sandwiches, and fish. Pt has tried Ensure and Boost supplements in the past, but cannot tolerate, as they give her diarrhea.   Per pt, her UBW is around 150#. She estimates she has lost 9 pounds since May. Reviewed wt hx; noted distant history of weight loss. Pt reports  concern over inadequate nutrition and suspects that this has lead to her leg weakness; this is first time that she has noticed this symptom during her immunotherapy treatments. Her main complaint is that her feet feel numb; she denies previous diagnosis of neuropathy.RD will check vitamin labs for potential micronutrient deficiencies secondary to weakness.   Discussed importance of good meal and supplement intake to promote healing. Pt amenable to Boost Breeze.   Medications reviewed and include lactated ringers infusion @ 100 ml/hr.   Labs reviewed: Mg: 1.6, CBGS: 153 (inpatient orders for glycemic control are none).    NUTRITION - FOCUSED PHYSICAL EXAM:  Flowsheet Row Most Recent Value  Orbital Region No depletion  Upper Arm Region No depletion  Thoracic and Lumbar Region No depletion  Buccal Region No depletion  Temple Region No depletion  Clavicle Bone Region No depletion  Clavicle and Acromion Bone Region No depletion  Scapular Bone Region No depletion  Dorsal Hand No depletion  Patellar Region Mild depletion  Anterior Thigh Region Mild depletion  Posterior Calf Region Mild depletion  Edema (RD Assessment) None  Hair Reviewed  Eyes Reviewed  Mouth Reviewed  Skin Reviewed  Nails Reviewed       Diet Order:   Diet Order             Diet clear liquid Room service appropriate? Yes; Fluid consistency: Thin  Diet effective now                   EDUCATION NEEDS:   Education needs  have been addressed  Skin:  Skin Assessment: Reviewed RN Assessment  Last BM:  07/20/22  Height:   Ht Readings from Last 1 Encounters:  07/23/22 '5\' 7"'$  (1.702 m)    Weight:   Wt Readings from Last 1 Encounters:  07/23/22 66.2 kg    Ideal Body Weight:  67.3 kg  BMI:  Body mass index is 22.87 kg/m.  Estimated Nutritional Needs:   Kcal:  1800-2000  Protein:  90-105 grams  Fluid:  > 1.8 L    Loistine Chance, RD, LDN, [place] Registered Dietitian II Certified Diabetes Care and  Education Specialist Please refer to [facility] for RD and/or RD on-call/weekend/after hours pager

## 2022-07-24 NOTE — Hospital Course (Addendum)
71 year old female with a known history of stage IV UVL melanoma undergoing immunotherapy at [facility] with Dr. Rico Ala, oncology is admitted for lower extremity weakness  9/30: Still feeling weak, nausea 10/1: Hypokalemia.  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  CSF studies pending, ID consult.  10/4: Patient with worsening lower extremity weakness and no sensation.  Now started having left upper extremity tingling, numbness and weakness.  Preliminary LP labs with no infection or malignant cells.  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 ID was able to contact her oncologist Dr. Rico Ala at [facility], who are recommending transfer to [place], obtaining CRP, ESR and Aldolase, starting her on high-dose steroid. Neurology and ID both are on board. Transfer process initiated. Patient is being started on Solu-Medrol 1 mg/kg twice daily.  10/5: CSF culture remain negative.  ESR, CRP and CK levels are within normal limits.  Aldolase pending. CBG significantly elevated secondary to steroid, starting her on basal and short-acting insulin. Continue to have no sensations or strength in lower extremities, worsening left upper extremity strength and numbness. Pending bed availability at [facility]-hopefully can go today.  Patient was able to find a bed at [facility] where she is being discharged for further management.

## 2022-07-24 NOTE — Progress Notes (Signed)
    Durable Medical Equipment  (From admission, onward)           Start     Ordered   07/24/22 1504  For home use only DME lightweight manual wheelchair with seat cushion  Once       Comments: Patient suffers from ambulation difficulty which impairs their ability to perform daily activities like toileting in the home.  A walker will not resolve  issue with performing activities of daily living. A wheelchair will allow patient to safely perform daily activities. Patient is not able to propel themselves in the home using a standard weight wheelchair due to general weakness. Patient can self propel in the lightweight wheelchair. Length of need 6 months . Accessories: elevating leg rests (ELRs), wheel locks, extensions and anti-tippers.   07/24/22 1504   07/24/22 1504  For home use only DME lightweight manual wheelchair with seat cushion  Once       Comments: Patient suffers from bilateral leg weakness which impairs their ability to perform daily activities like dressing, grooming, and toileting in the home.  A walker will not resolve  issue with performing activities of daily living. A wheelchair will allow patient to safely perform daily activities. Patient is not able to propel themselves in the home using a standard weight wheelchair due to general weakness. Patient can self propel in the lightweight wheelchair. Length of need 6 months . Accessories: elevating leg rests (ELRs), wheel locks, extensions and anti-tippers, back cushion and seat cushion.   07/24/22 1509   07/24/22 1502  For home use only DME Walker rolling  Once       Question Answer Comment  Walker: With [place]   Patient needs a walker to treat with the following condition General weakness      07/24/22 1501   07/24/22 1502  For home use only DME Bedside commode  Once       Question:  Patient needs a bedside commode to treat with the following condition  Answer:  Impaired ambulation   07/24/22 1501

## 2022-07-24 NOTE — Assessment & Plan Note (Deleted)
Glycemic protocol.   

## 2022-07-24 NOTE — Assessment & Plan Note (Signed)
Elevated TSH at 24.62.  Increase dose of Synthroid from 75 to 100 mcg

## 2022-07-25 DIAGNOSIS — R45851 Suicidal ideations: Secondary | ICD-10-CM | POA: Diagnosis not present

## 2022-07-25 DIAGNOSIS — G96198 Other disorders of meninges, not elsewhere classified: Secondary | ICD-10-CM | POA: Diagnosis not present

## 2022-07-25 DIAGNOSIS — F322 Major depressive disorder, single episode, severe without psychotic features: Secondary | ICD-10-CM | POA: Diagnosis present

## 2022-07-25 DIAGNOSIS — Z1152 Encounter for screening for COVID-19: Secondary | ICD-10-CM | POA: Diagnosis not present

## 2022-07-25 DIAGNOSIS — Z888 Allergy status to other drugs, medicaments and biological substances status: Secondary | ICD-10-CM | POA: Diagnosis not present

## 2022-07-25 DIAGNOSIS — Z7984 Long term (current) use of oral hypoglycemic drugs: Secondary | ICD-10-CM | POA: Diagnosis not present

## 2022-07-25 DIAGNOSIS — R112 Nausea with vomiting, unspecified: Secondary | ICD-10-CM

## 2022-07-25 DIAGNOSIS — K219 Gastro-esophageal reflux disease without esophagitis: Secondary | ICD-10-CM | POA: Diagnosis present

## 2022-07-25 DIAGNOSIS — Z7985 Long-term (current) use of injectable non-insulin antidiabetic drugs: Secondary | ICD-10-CM | POA: Diagnosis not present

## 2022-07-25 DIAGNOSIS — T451X5A Adverse effect of antineoplastic and immunosuppressive drugs, initial encounter: Secondary | ICD-10-CM | POA: Diagnosis present

## 2022-07-25 DIAGNOSIS — E119 Type 2 diabetes mellitus without complications: Secondary | ICD-10-CM | POA: Diagnosis present

## 2022-07-25 DIAGNOSIS — F17210 Nicotine dependence, cigarettes, uncomplicated: Secondary | ICD-10-CM | POA: Diagnosis present

## 2022-07-25 DIAGNOSIS — R270 Ataxia, unspecified: Secondary | ICD-10-CM | POA: Diagnosis present

## 2022-07-25 DIAGNOSIS — G610 Guillain-Barre syndrome: Secondary | ICD-10-CM | POA: Diagnosis present

## 2022-07-25 DIAGNOSIS — E063 Autoimmune thyroiditis: Secondary | ICD-10-CM | POA: Diagnosis present

## 2022-07-25 DIAGNOSIS — E039 Hypothyroidism, unspecified: Secondary | ICD-10-CM | POA: Diagnosis not present

## 2022-07-25 DIAGNOSIS — C7802 Secondary malignant neoplasm of left lung: Secondary | ICD-10-CM | POA: Diagnosis present

## 2022-07-25 DIAGNOSIS — G8250 Quadriplegia, unspecified: Secondary | ICD-10-CM | POA: Diagnosis present

## 2022-07-25 DIAGNOSIS — C6941 Malignant neoplasm of right ciliary body: Secondary | ICD-10-CM | POA: Diagnosis not present

## 2022-07-25 DIAGNOSIS — E876 Hypokalemia: Secondary | ICD-10-CM | POA: Diagnosis not present

## 2022-07-25 DIAGNOSIS — C439 Malignant melanoma of skin, unspecified: Secondary | ICD-10-CM | POA: Diagnosis present

## 2022-07-25 DIAGNOSIS — T380X5A Adverse effect of glucocorticoids and synthetic analogues, initial encounter: Secondary | ICD-10-CM | POA: Diagnosis not present

## 2022-07-25 DIAGNOSIS — Z79899 Other long term (current) drug therapy: Secondary | ICD-10-CM | POA: Diagnosis not present

## 2022-07-25 DIAGNOSIS — Z79890 Hormone replacement therapy (postmenopausal): Secondary | ICD-10-CM | POA: Diagnosis not present

## 2022-07-25 DIAGNOSIS — R262 Difficulty in walking, not elsewhere classified: Secondary | ICD-10-CM | POA: Diagnosis not present

## 2022-07-25 DIAGNOSIS — R29898 Other symptoms and signs involving the musculoskeletal system: Secondary | ICD-10-CM | POA: Diagnosis present

## 2022-07-25 DIAGNOSIS — I10 Essential (primary) hypertension: Secondary | ICD-10-CM | POA: Diagnosis present

## 2022-07-25 DIAGNOSIS — R29810 Facial weakness: Secondary | ICD-10-CM | POA: Diagnosis present

## 2022-07-25 LAB — BASIC METABOLIC PANEL
Anion gap: 9 (ref 5–15)
BUN: 16 mg/dL (ref 8–23)
CO2: 24 mmol/L (ref 22–32)
Calcium: 9 mg/dL (ref 8.9–10.3)
Chloride: 104 mmol/L (ref 98–111)
Creatinine, Ser: 1.5 mg/dL — ABNORMAL HIGH (ref 0.44–1.00)
GFR, Estimated: 37 mL/min — ABNORMAL LOW (ref >60)
Glucose, Bld: 167 mg/dL — ABNORMAL HIGH (ref 70–99)
Potassium: 3.1 mmol/L — ABNORMAL LOW (ref 3.5–5.1)
Sodium: 137 mmol/L (ref 135–145)

## 2022-07-25 LAB — VITAMIN B12: Vitamin B-12: 2609 pg/mL — ABNORMAL HIGH (ref 180–914)

## 2022-07-25 LAB — MAGNESIUM: Magnesium: 2.2 mg/dL (ref 1.7–2.4)

## 2022-07-25 MED ORDER — MAGNESIUM SULFATE 2 GM/50ML IV SOLN
2.0000 g | Freq: Once | INTRAVENOUS | Status: AC
  Administered 2022-07-25: 2 g via INTRAVENOUS
  Filled 2022-07-25: qty 50

## 2022-07-25 MED ORDER — PANTOPRAZOLE SODIUM 40 MG PO TBEC
40.0000 mg | DELAYED_RELEASE_TABLET | Freq: Two times a day (BID) | ORAL | Status: DC
  Administered 2022-07-25 – 2022-07-30 (×11): 40 mg via ORAL
  Filled 2022-07-25 (×11): qty 1

## 2022-07-25 NOTE — Assessment & Plan Note (Addendum)
Likely multifactorial as above.  PT recommends SNF but patient refused and wanting to go home with home health

## 2022-07-25 NOTE — Plan of Care (Signed)
  Problem: Pain Managment: Goal: General experience of comfort will improve Outcome: Progressing   Problem: Safety: Goal: Ability to remain free from injury will improve Outcome: Progressing   Problem: Skin Integrity: Goal: Risk for impaired skin integrity will decrease Outcome: Progressing   

## 2022-07-25 NOTE — Progress Notes (Signed)
  Progress Note   Patient: Sara Pittman JKK:[id number] DOB: 1951-09-05 DOA: 07/23/2022     0 DOS: the patient was seen and examined on 07/25/2022   Brief hospital course: 71 year old female with a known history of stage IV UVL melanoma undergoing immunotherapy at [facility] with Dr. Rico Ala, oncology is admitted for lower extremity weakness  9/30: Still feeling weak, nausea     Assessment and Plan: * Leg weakness, bilateral Suspect this is multifactorial Mag was low, replaced cpk normal.  Lactic normal. B12 level -quite high (7416), I am not sure if this is false positive considering she was given IV vitamin B12 on admission and if B12 was collected right after that this could be falsely elevated.  I will recheck Folate normal MRI lumbar spine WNL. Vitamin D is low at 24.36 TSH is high at 24.62 and his dose of levothyroxine from 75 to 100 mcg/day    Impaired ambulation Likely multifactorial as above.  PT recommends SNF but patient refused and wanting to go home with home health  Hypothyroidism Elevated TSH at 24.62.  Increase dose of Synthroid from 75 to 100 mcg  Hypercalcemia Given fluid.  Recheck pending   GERD (gastroesophageal reflux disease) Continue PPI.  Switch from IV to oral Advised pt to refrain from goody/ [facility] / NSAID.    Nausea & vomiting Related to GERD/ suspect underlying esophagitis.  Switch intravenous Protonix to oral  DM II (diabetes mellitus, type II), controlled ([place]) Well-controlled for now        Subjective: Still feeling very weak.  Nauseous and not interested in eating meal  Physical Exam: Vitals:   07/24/22 1540 07/24/22 2335 07/25/22 0412 07/25/22 0851  BP: 105/75 (!) 140/84 (!) 142/80 121/70  Pulse: 70 60 (!) 58 62  Resp: '20 18 18 16  '$ Temp: 98.6 F (37 C) 98.5 F (36.9 C) 97.6 F (36.4 C) 97.9 F (36.6 C)  TempSrc:      SpO2: 99% 100% 99% 96%  Weight:      Height:       71 year old female lying in the bed comfortably  without any acute distress Lungs clear to auscultation bilaterally Cardiovascular regular rate and rhythm Abdomen soft, benign Neuro alert and awake, nonfocal Skin no rash or lesion Psych normal mood and affect Data Reviewed:  Vitamin B-12 7416, vitamin D 24.36, magnesium 1.6  Family Communication: None  Disposition: Status is: Observation The patient remains OBS appropriate and will d/c before 2 midnights.  Planned Discharge Destination: Home with Home Health    DVT prophylaxis-Lovenox Time spent: 35 minutes  Author: Max Sane, MD 07/25/2022 1:44 PM  For on call review [email or web address].

## 2022-07-26 DIAGNOSIS — R29898 Other symptoms and signs involving the musculoskeletal system: Secondary | ICD-10-CM | POA: Diagnosis not present

## 2022-07-26 DIAGNOSIS — E039 Hypothyroidism, unspecified: Secondary | ICD-10-CM | POA: Diagnosis not present

## 2022-07-26 DIAGNOSIS — R112 Nausea with vomiting, unspecified: Secondary | ICD-10-CM | POA: Diagnosis not present

## 2022-07-26 LAB — CBC
HCT: 37.5 % (ref 36.0–46.0)
Hemoglobin: 12.5 g/dL (ref 12.0–15.0)
MCH: 27.7 pg (ref 26.0–34.0)
MCHC: 33.3 g/dL (ref 30.0–36.0)
MCV: 83.1 fL (ref 80.0–100.0)
Platelets: 301 10*3/uL (ref 150–400)
RBC: 4.51 MIL/uL (ref 3.87–5.11)
RDW: 14.8 % (ref 11.5–15.5)
WBC: 5.5 10*3/uL (ref 4.0–10.5)
nRBC: 0 % (ref 0.0–0.2)

## 2022-07-26 LAB — BASIC METABOLIC PANEL
Anion gap: 7 (ref 5–15)
BUN: 12 mg/dL (ref 8–23)
CO2: 25 mmol/L (ref 22–32)
Calcium: 9.1 mg/dL (ref 8.9–10.3)
Chloride: 108 mmol/L (ref 98–111)
Creatinine, Ser: 1.31 mg/dL — ABNORMAL HIGH (ref 0.44–1.00)
GFR, Estimated: 44 mL/min — ABNORMAL LOW (ref >60)
Glucose, Bld: 175 mg/dL — ABNORMAL HIGH (ref 70–99)
Potassium: 2.9 mmol/L — ABNORMAL LOW (ref 3.5–5.1)
Sodium: 140 mmol/L (ref 135–145)

## 2022-07-26 LAB — MAGNESIUM: Magnesium: 2.3 mg/dL (ref 1.7–2.4)

## 2022-07-26 MED ORDER — POTASSIUM CHLORIDE CRYS ER 20 MEQ PO TBCR
40.0000 meq | EXTENDED_RELEASE_TABLET | Freq: Once | ORAL | Status: AC
  Administered 2022-07-26: 40 meq via ORAL
  Filled 2022-07-26: qty 2

## 2022-07-26 MED ORDER — HYDROCODONE-ACETAMINOPHEN 5-325 MG PO TABS
1.0000 | ORAL_TABLET | Freq: Four times a day (QID) | ORAL | Status: DC | PRN
  Administered 2022-07-26 – 2022-07-27 (×3): 1 via ORAL
  Filled 2022-07-26 (×3): qty 1

## 2022-07-26 MED ORDER — POTASSIUM CHLORIDE CRYS ER 20 MEQ PO TBCR
20.0000 meq | EXTENDED_RELEASE_TABLET | Freq: Two times a day (BID) | ORAL | Status: DC
  Administered 2022-07-26 – 2022-07-29 (×7): 20 meq via ORAL
  Filled 2022-07-26 (×7): qty 1

## 2022-07-26 NOTE — NC FL2 (Signed)
[place] LEVEL OF CARE SCREENING TOOL     IDENTIFICATION  Patient Name: Sara Pittman Birthdate: 1950-12-26 Sex: female Admission Date (Current Location): 07/23/2022  [facility] and [place] Number:  Engineering geologist and Address:  [facility], [address], [place], [place] [address]      Provider Number:    Attending Physician Name and Address:  Max Sane, MD  Relative Name and Phone Number:  Arminda Resides [phone]    Current Level of Care: Hospital Recommended Level of Care: [place] Prior Approval Number:    Date Approved/Denied:   PASRR Number: [id number]  Discharge Plan: SNF    Current Diagnoses: Patient Active Problem List   Diagnosis Date Noted   Leg weakness, bilateral 07/24/2022   Nausea & vomiting 07/24/2022   GERD (gastroesophageal reflux disease) 07/24/2022   Hypercalcemia 07/24/2022   Hypothyroidism 07/24/2022   Impaired ambulation    DM II (diabetes mellitus, type II), controlled ([place]) 10/25/2018   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    Orientation RESPIRATION BLADDER Height & Weight     Self, Time, Situation  Normal Continent (wears adult diaper) Weight: 146 lb (66.2 kg) Height:  '5\' 7"'$  (170.2 cm)  BEHAVIORAL SYMPTOMS/MOOD NEUROLOGICAL BOWEL NUTRITION STATUS      Continent Diet  AMBULATORY STATUS COMMUNICATION OF NEEDS Skin   Extensive Assist Verbally Normal                       Personal Care Assistance Level of Assistance  Bathing, Feeding Bathing Assistance: Limited assistance Feeding assistance: Independent       Functional Limitations Info  Sight, Hearing Sight Info: 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) Hearing Info: Impaired (wears a hearing aid)      [place]  PT (By licensed PT), OT (By licensed OT)     PT Frequency: 5x per week OT Frequency: 5x per weel            Contractures Contractures Info: Not present     Additional Factors Info  Code Status Code Status Info: full code             Current Medications (07/26/2022):  This is the current hospital active medication list Current Facility-Administered Medications  Medication Dose Route Frequency Provider Last Rate Last Admin   acetaminophen (TYLENOL) tablet 650 mg  650 mg Oral Q6H PRN Para Skeans, MD       Or   acetaminophen (TYLENOL) suppository 650 mg  650 mg Rectal Q6H PRN Para Skeans, MD       aspirin EC tablet 81 mg  81 mg Oral Daily Florina Ou V, MD   81 mg at 07/26/22 1054   enoxaparin (LOVENOX) injection 40 mg  40 mg Subcutaneous Q24H Wynelle Cleveland, RPH   40 mg at 07/25/22 2105   feeding supplement (BOOST / RESOURCE BREEZE) liquid 1 Container  1 Container Oral TID BM Max Sane, MD   1 Container at 07/26/22 1452   hydrALAZINE (APRESOLINE) injection 10 mg  10 mg Intravenous Q6H PRN Para Skeans, MD       lactated ringers infusion   Intravenous Continuous Para Skeans, MD 100 mL/hr at 07/26/22 1455 New Bag at 07/26/22 1455   levothyroxine (SYNTHROID) tablet 100 mcg  100 mcg Oral Q0600 Max Sane, MD   100 mcg at 07/26/22 0521   losartan (COZAAR) tablet 100 mg  100 mg Oral Daily Posey Pronto,  Gretta Cool, MD   100 mg at 07/26/22 1053   metoprolol succinate (TOPROL-XL) 24 hr tablet 50 mg  50 mg Oral Daily Florina Ou V, MD   50 mg at 07/26/22 1053   morphine (PF) 2 MG/ML injection 2 mg  2 mg Intravenous Q2H PRN Para Skeans, MD   2 mg at 07/26/22 8676   multivitamin with minerals tablet 1 tablet  1 tablet Oral Daily Max Sane, MD   1 tablet at 07/26/22 1054   ondansetron (ZOFRAN) tablet 4 mg  4 mg Oral Q6H PRN Para Skeans, MD       Or   ondansetron [facility]) injection 4 mg  4 mg Intravenous Q6H PRN Para Skeans, MD   4 mg at 07/24/22 0728   pantoprazole (PROTONIX) EC tablet 40 mg  40 mg Oral BID AC Shah, Vipul, MD   40 mg at 07/26/22 1055   potassium chloride SA (KLOR-CON M) CR tablet 20 mEq  20 mEq Oral BID Max Sane, MD   20  mEq at 07/26/22 1054   topiramate (TOPAMAX) tablet 25 mg  25 mg Oral Daily Para Skeans, MD   25 mg at 07/26/22 1054     Discharge Medications: Please see discharge summary for a list of discharge medications.  Relevant Imaging Results:  Relevant Lab Results:   Additional Information SS# [id number]  Elliot Gurney [place], [place]

## 2022-07-26 NOTE — Assessment & Plan Note (Signed)
Likely multifactorial as above.  PT recommends SNF but patient refused and wanting to go home with home health.  She has agreed now to go to [facility]

## 2022-07-26 NOTE — TOC Progression Note (Signed)
Transition of Care [facility]) - Progression Note    Patient Details  Name: Sara Pittman MRN: [id number] Date of Birth: [birth year]  Transition of [facility]) CM/SW Contact  Xinyi Batton, [place], [place] Phone Number: 07/26/2022, 4:05 PM  Clinical Narrative:    Phone call to patient who is now agreeable to SNF. Fl2 completed and faxed out to local facilities. Patient has a preference of [facility] or [facility].  Transition of Care to continue to follow  [facility], LCSW Transition of Care [phone]      Expected Discharge Plan: [place] Barriers to Discharge: Continued Medical Work up, Barriers Unresolved (comment) ([place] pending)  Expected Discharge Plan and Services Expected Discharge Plan: [place] Choice: [place] arrangements for the past 2 months: Single Family Home                           HH Arranged: PT, OT           Social Determinants of Health (SDOH) Interventions    Readmission Risk Interventions     No data to display

## 2022-07-26 NOTE — Assessment & Plan Note (Signed)
Elevated TSH at 24.62.  Increase dose of Synthroid from 75 to 100 mcg.

## 2022-07-26 NOTE — Progress Notes (Signed)
  Progress Note   Patient: Sara Pittman RCB:[id number] DOB: 1951-09-28 DOA: 07/23/2022     1 DOS: the patient was seen and examined on 07/26/2022   Brief hospital course: 71 year old female with a known history of stage IV UVL melanoma undergoing immunotherapy at [facility] with Dr. Rico Ala, oncology is admitted for lower extremity weakness  9/30: Still feeling weak, nausea 10/1: Hypokalemia.  Now patient is agreeable for SNF placement     Assessment and Plan: * Leg weakness, bilateral Suspect this is multifactorial Mag was low, replaced K is low and is being replaced per pharmacist cpk normal.  Lactic normal. B12 level -quite high (7416), I am not sure if this is false positive considering she was given IV vitamin B12 on admission and if B12 was collected right after that this could be falsely elevated. recheck is improved and at 2609 Folate normal MRI lumbar spine WNL. Vitamin D is low at 24.36 TSH is high at 24.62 and his dose of levothyroxine from 75 to 100 mcg/day    Impaired ambulation Likely multifactorial as above.  PT recommends SNF but patient refused and wanting to go home with home health.  She has agreed now to go to SNF  Hypothyroidism Elevated TSH at 24.62.  Increase dose of Synthroid from 75 to 100 mcg.  Hypercalcemia Now normalized status post IV hydration   GERD (gastroesophageal reflux disease) Continue PPI.     Nausea & vomiting Related to GERD/ suspect underlying esophagitis.   Continue Protonix  DM II (diabetes mellitus, type II), controlled ([place]) Well-controlled for now.        Subjective: Feeling nauseous, still weak.  Debating on consideration for SNF now  Physical Exam: Vitals:   07/26/22 0428 07/26/22 1052 07/26/22 1718 07/26/22 2017  BP: (!) 147/73 (!) 147/71 (!) 164/88 (!) 181/92  Pulse: 60 65 65 64  Resp: '17 20 18 18  '$ Temp: (!) 97.5 F (36.4 C) 98.2 F (36.8 C) 97.9 F (36.6 C) 98.1 F (36.7 C)  TempSrc:  Oral    SpO2:  100% 100% 98% 99%  Weight:      Height:       71 year old female lying in the bed comfortably without any acute distress Lungs clear to auscultation bilaterally Cardiovascular regular rate and rhythm Abdomen soft, benign Neuro alert and awake, nonfocal Skin no rash or lesion Psych normal mood and affect Data Reviewed:  Potassium 2.9  Family Communication: None  Disposition: Status is: Inpatient Remains inpatient appropriate because: Electrolyte management.  Waiting for SNF placement   Planned Discharge Destination: Skilled nursing facility    DVT prophylaxis-Lovenox Time spent: 35 minutes  Author: Max Sane, MD 07/26/2022 10:17 PM  For on call review [email or web address].

## 2022-07-26 NOTE — Assessment & Plan Note (Signed)
Well-controlled for now.

## 2022-07-26 NOTE — Assessment & Plan Note (Signed)
Now normalized status post IV hydration

## 2022-07-26 NOTE — Assessment & Plan Note (Signed)
Continue PPI ?

## 2022-07-26 NOTE — Assessment & Plan Note (Signed)
Related to GERD/ suspect underlying esophagitis.   Continue Protonix

## 2022-07-26 NOTE — Assessment & Plan Note (Addendum)
Suspect this is multifactorial Mag was low, replaced K is low and is being replaced per pharmacist cpk normal.  Lactic normal. B12 level -quite high (7416), I am not sure if this is false positive considering she was given IV vitamin B12 on admission and if B12 was collected right after that this could be falsely elevated. recheck is improved and at 2609 Folate normal MRI lumbar spine WNL. Vitamin D is low at 24.36 TSH is high at 24.62 and his dose of levothyroxine from 75 to 100 mcg/day

## 2022-07-27 ENCOUNTER — Inpatient Hospital Stay: Payer: Medicare Other

## 2022-07-27 DIAGNOSIS — G610 Guillain-Barre syndrome: Secondary | ICD-10-CM | POA: Diagnosis not present

## 2022-07-27 DIAGNOSIS — F322 Major depressive disorder, single episode, severe without psychotic features: Secondary | ICD-10-CM | POA: Diagnosis present

## 2022-07-27 DIAGNOSIS — R29898 Other symptoms and signs involving the musculoskeletal system: Secondary | ICD-10-CM | POA: Diagnosis not present

## 2022-07-27 DIAGNOSIS — R262 Difficulty in walking, not elsewhere classified: Secondary | ICD-10-CM | POA: Diagnosis not present

## 2022-07-27 DIAGNOSIS — R112 Nausea with vomiting, unspecified: Secondary | ICD-10-CM | POA: Diagnosis not present

## 2022-07-27 LAB — BASIC METABOLIC PANEL
Anion gap: 9 (ref 5–15)
BUN: 10 mg/dL (ref 8–23)
CO2: 22 mmol/L (ref 22–32)
Calcium: 9.2 mg/dL (ref 8.9–10.3)
Chloride: 105 mmol/L (ref 98–111)
Creatinine, Ser: 1.08 mg/dL — ABNORMAL HIGH (ref 0.44–1.00)
GFR, Estimated: 55 mL/min — ABNORMAL LOW (ref >60)
Glucose, Bld: 171 mg/dL — ABNORMAL HIGH (ref 70–99)
Potassium: 3.5 mmol/L (ref 3.5–5.1)
Sodium: 136 mmol/L (ref 135–145)

## 2022-07-27 LAB — CBC
HCT: 40 % (ref 36.0–46.0)
Hemoglobin: 13.1 g/dL (ref 12.0–15.0)
MCH: 28.2 pg (ref 26.0–34.0)
MCHC: 32.8 g/dL (ref 30.0–36.0)
MCV: 86 fL (ref 80.0–100.0)
Platelets: 281 10*3/uL (ref 150–400)
RBC: 4.65 MIL/uL (ref 3.87–5.11)
RDW: 15.1 % (ref 11.5–15.5)
WBC: 5.5 10*3/uL (ref 4.0–10.5)
nRBC: 0 % (ref 0.0–0.2)

## 2022-07-27 LAB — VITAMIN E
Vitamin E (Alpha Tocopherol): 11.8 mg/L (ref 9.0–29.0)
Vitamin E(Gamma Tocopherol): 1.6 mg/L (ref 0.5–4.9)

## 2022-07-27 LAB — VITAMIN B12: Vitamin B-12: 1233 pg/mL — ABNORMAL HIGH (ref 180–914)

## 2022-07-27 LAB — VITAMIN B1: Vitamin B1 (Thiamine): 112 nmol/L (ref 66.5–200.0)

## 2022-07-27 MED ORDER — GADOBUTROL 1 MMOL/ML IV SOLN
6.0000 mL | Freq: Once | INTRAVENOUS | Status: AC | PRN
  Administered 2022-07-27: 6 mL via INTRAVENOUS

## 2022-07-27 MED ORDER — ESCITALOPRAM OXALATE 10 MG PO TABS
5.0000 mg | ORAL_TABLET | Freq: Every day | ORAL | Status: DC
  Administered 2022-07-27 – 2022-07-30 (×4): 5 mg via ORAL
  Filled 2022-07-27 (×4): qty 1

## 2022-07-27 MED ORDER — DOCUSATE SODIUM 100 MG PO CAPS: 100.0000 mg | ORAL_CAPSULE | Freq: Two times a day (BID) | ORAL | Status: DC | PRN

## 2022-07-27 MED ORDER — POLYETHYLENE GLYCOL 3350 17 G PO PACK
17.0000 g | PACK | Freq: Every day | ORAL | Status: DC | PRN
  Filled 2022-07-27: qty 1

## 2022-07-27 MED ORDER — AMLODIPINE BESYLATE 5 MG PO TABS
5.0000 mg | ORAL_TABLET | Freq: Every day | ORAL | Status: DC
  Administered 2022-07-27 – 2022-07-30 (×4): 5 mg via ORAL
  Filled 2022-07-27 (×4): qty 1

## 2022-07-27 MED ORDER — VITAMIN D 25 MCG (1000 UNIT) PO TABS
1000.0000 [IU] | ORAL_TABLET | Freq: Every day | ORAL | Status: DC
  Administered 2022-07-28 – 2022-07-30 (×3): 1000 [IU] via ORAL
  Filled 2022-07-27 (×3): qty 1

## 2022-07-27 MED ORDER — SENNA 8.6 MG PO TABS: 2.0000 | ORAL_TABLET | Freq: Every evening | ORAL | Status: DC | PRN

## 2022-07-27 NOTE — Assessment & Plan Note (Signed)
Well-controlled for now

## 2022-07-27 NOTE — Assessment & Plan Note (Signed)
Elevated TSH at 24.62.  Increase dose of Synthroid from 75 to 100 mcg.

## 2022-07-27 NOTE — Assessment & Plan Note (Signed)
Continue PPI ?

## 2022-07-27 NOTE — Progress Notes (Signed)
Physical Therapy Treatment Patient Details Name: Sara Pittman MRN: [id number] DOB: [birth year] Today's Date: 07/27/2022   History of Present Illness Pt is a 71 y/o F admitted on 07/23/22 after presenting with c/o BLE weakness for the past few days, falling twice, as well as nausea. Pt's BLE weakness is suspected 2/2 electrolyte abnormality. PMH: DM, GERD, heart murmur, HTN (Simultaneous filing. User may not have seen previous data.)    PT Comments    Pt is reporting increased weakness in B LE and reports last night her L UE began feeling numb and weaker. Strength tested demonstrated increased weakness in grip/bicep/tricep (3/5) and 2/5 shoulder flexion. Pt agreeable to sit at EOB and attempted sit<>Stand however needed max assist and unable to fully achieve lift off of buttocks. Pt with depressed affect mentioning several times about wanting to harm self. Concerns relayed to care team in addition to new weakness noted. Will continue to progress as able.  Recommendations for follow up therapy are one component of a multi-disciplinary discharge planning process, led by the attending physician.  Recommendations may be updated based on patient status, additional functional criteria and insurance authorization.  Follow Up Recommendations  Skilled nursing-short term rehab (<3 hours/day) Can patient physically be transported by private vehicle: No   Assistance Recommended at Discharge Frequent or constant Supervision/Assistance  Patient can return home with the following Two people to help with walking and/or transfers;Two people to help with bathing/dressing/bathroom;Assist for transportation;Help with stairs or ramp for entrance   Equipment Recommendations   (TBD)    Recommendations for Other Services       Precautions / Restrictions Precautions Precautions: Fall Restrictions Weight Bearing Restrictions: No     Mobility  Bed Mobility Overal bed mobility: Needs Assistance Bed  Mobility: Supine to Sit     Supine to sit: Mod assist     General bed mobility comments: assist required for B LEs and trunkal elevation. Once seated at EOB, unsteadiness noted, with increased sway. Poor tolerance to sitting, only able to tolerate approx 5 mins    Transfers Overall transfer level: Needs assistance Equipment used: Rolling walker (2 wheels) Transfers: Sit to/from Stand Sit to Stand: Max assist           General transfer comment: attempted to stand, unable to keep L hand on RW and unable to utilize B LEs for standing despite max assist. Not safe to attempt bed->chair mobility.    Ambulation/Gait               General Gait Details: unsafe   Stairs             Wheelchair Mobility    Modified Rankin (Stroke Patients Only)       Balance Overall balance assessment: Needs assistance Sitting-balance support: Feet supported, No upper extremity supported Sitting balance-Leahy Scale: Fair                                      Cognition Arousal/Alertness: Awake/alert Behavior During Therapy: WFL for tasks assessed/performed, Flat affect Overall Cognitive Status: Within Functional Limits for tasks assessed                                 General Comments: appeared with depressed affect        Exercises Other Exercises Other Exercises: supine ther-ex performed on B LE  including AP, quad sets, SLRs, heel slides and LAQ. Mod/max assist for performance due to weakness and numbness.    General Comments        Pertinent Vitals/Pain Pain Assessment Pain Assessment: Faces Faces Pain Scale: Hurts even more Pain Location: B LE pain with mobility Pain Descriptors / Indicators: Aching Pain Intervention(s): Limited activity within patient's tolerance, Repositioned, Patient requesting pain meds-RN notified    Home Living                          Prior Function            PT Goals (current goals can now  be found in the care plan section) Acute Rehab PT Goals Patient Stated Goal: get better, get the feeling back in her legs PT Goal Formulation: With patient Time For Goal Achievement: 08/07/22 Potential to Achieve Goals: Fair Progress towards PT goals: Progressing toward goals    Frequency    Min 2X/week      PT Plan Current plan remains appropriate    Co-evaluation              AM-PAC PT "6 Clicks" Mobility   Outcome Measure  Help needed turning from your back to your side while in a flat bed without using bedrails?: A Little Help needed moving from lying on your back to sitting on the side of a flat bed without using bedrails?: A Lot Help needed moving to and from a bed to a chair (including a wheelchair)?: Total Help needed standing up from a chair using your arms (e.g., wheelchair or bedside chair)?: Total Help needed to walk in hospital room?: Total Help needed climbing 3-5 steps with a railing? : Total 6 Click Score: 9    End of Session Equipment Utilized During Treatment: Gait belt Activity Tolerance: Patient limited by pain Patient left: in bed;with bed alarm set Nurse Communication: Mobility status PT Visit Diagnosis: Muscle weakness (generalized) (M62.81);Other abnormalities of gait and mobility (R26.89);Difficulty in walking, not elsewhere classified (R26.2);Unsteadiness on feet (R26.81)     Time: 0086-7619 PT Time Calculation (min) (ACUTE ONLY): 23 min  Charges:  $Therapeutic Exercise: 23-37 mins                     Greggory Stallion, PT, DPT, GCS [phone]    Sara Pittman 07/27/2022, 11:35 AM

## 2022-07-27 NOTE — Assessment & Plan Note (Signed)
Seen by psychiatry.  Lexapro started today

## 2022-07-27 NOTE — Consult Note (Signed)
[facility] Consult   Reason for Consult: Consult for 71 year old woman in the hospital with multiple medical problems concern about depression Referring Physician:  Manuella Ghazi Patient Identification: Sara Pittman MRN:  [id number] Principal Diagnosis: Severe major depression, single episode, without psychotic features ([place]) Diagnosis:  Principal Problem:   Severe major depression, single episode, without psychotic features ([place]) Active Problems:   DM II (diabetes mellitus, type II), controlled ([place])   Leg weakness, bilateral   Nausea & vomiting   GERD (gastroesophageal reflux disease)   Hypercalcemia   Hypothyroidism   Impaired ambulation   Total Time spent with patient: 45 minutes  Subjective:   Sara Pittman is a 71 y.o. female patient admitted with "the change and going through his too much".  HPI: Patient seen and chart reviewed.  71 year old woman with multiple medical problems reports that she had been pretty stable until this past week when she found that her weakness was getting so much worse she was no longer able to care for self.  Now she is agreeable to going to rehab and possibly skilled nursing but feels very depressed.  Mood is down and sad and negative with lots of negative thoughts about herself.  Suicidal thoughts without any intention or plan.  No psychotic symptoms.  Poor sleep at night poor appetite.  Past Psychiatric History: No prior psych history  Risk to Self:   Risk to Others:   Prior Inpatient Therapy:   Prior Outpatient Therapy:    Past Medical History:  Past Medical History:  Diagnosis Date   Diabetes mellitus without complication (HCC)    Edema    feet/leg   GERD (gastroesophageal reflux disease)    Heart murmur    Hypertension    Hypothyroidism     Past Surgical History:  Procedure Laterality Date   ABDOMINAL HYSTERECTOMY     AMPUTATION     tip of finger   BIOPSY THYROID     CATARACT EXTRACTION W/PHACO Right  02/16/2017   Procedure: CATARACT EXTRACTION PHACO AND INTRAOCULAR LENS PLACEMENT ([place]);  Surgeon: Birder Robson, MD;  Location: [facility] ORS;  Service: Ophthalmology;  Laterality: Right;  [place] 52.5 AP% 17.7 CDE 9.28 FLUID PACK LOT # [id number]   CATARACT EXTRACTION W/PHACO Left 03/09/2017   Procedure: CATARACT EXTRACTION PHACO AND INTRAOCULAR LENS PLACEMENT ([facility]);  Surgeon: Birder Robson, MD;  Location: [facility] ORS;  Service: Ophthalmology;  Laterality: Left;  [place] 1:02.2 AP% 19.8 CDE 12.30 Fluid Pack Lot # [id number]   Family History:  Family History  Problem Relation Age of Onset   Bladder Cancer Neg Hx    Kidney cancer Neg Hx    Prostate cancer Neg Hx    Family Psychiatric  History: See previous Social History:  Social History   Substance and Sexual Activity  Alcohol Use No     Social History   Substance and Sexual Activity  Drug Use No    Social History   Socioeconomic History   Marital status: Single    Spouse name: Not on file   Number of children: Not on file   Years of education: Not on file   Highest education level: Not on file  Occupational History   Not on file  Tobacco Use   Smoking status: Every Day    Packs/day: 0.50    Types: Cigarettes   Smokeless tobacco: Never  Vaping Use   Vaping Use: Never used  Substance and Sexual Activity   Alcohol use: No   Drug use: No  Sexual activity: Yes    Birth control/protection: Post-menopausal, Surgical  Other Topics Concern   Not on file  Social History Narrative   Not on file   Social Determinants of Health   Financial Resource Strain: Not on file  Food Insecurity: No Food Insecurity (07/24/2022)   Hunger Vital Sign    Worried About Running Out of Food in the Last Year: Never true    Ran Out of Food in the Last Year: Never true  Transportation Needs: No Transportation Needs (07/24/2022)   PRAPARE - Hydrologist (Medical): No    Lack of Transportation (Non-Medical): No  Physical  Activity: Not on file  Stress: Not on file  Social Connections: Not on file   Additional Social History:    Allergies:   Allergies  Allergen Reactions   Crestor [Rosuvastatin] Other (See Comments)    All over aches     Labs:  Results for orders placed or performed during the hospital encounter of 07/23/22 (from the past 48 hour(s))  CBC     Status: None   Collection Time: 07/26/22  5:07 AM  Result Value Ref Range   WBC 5.5 4.0 - 10.5 K/uL   RBC 4.51 3.87 - 5.11 MIL/uL   Hemoglobin 12.5 12.0 - 15.0 g/dL   HCT 37.5 36.0 - 46.0 %   MCV 83.1 80.0 - 100.0 fL   MCH 27.7 26.0 - 34.0 pg   MCHC 33.3 30.0 - 36.0 g/dL   RDW 14.8 11.5 - 15.5 %   Platelets 301 150 - 400 K/uL   nRBC 0.0 0.0 - 0.2 %    Comment: Performed at [facility], [address], [place], [place] [address]  Basic metabolic panel     Status: Abnormal   Collection Time: 07/26/22  5:07 AM  Result Value Ref Range   Sodium 140 135 - 145 mmol/L   Potassium 2.9 (L) 3.5 - 5.1 mmol/L   Chloride 108 98 - 111 mmol/L   CO2 25 22 - 32 mmol/L   Glucose, Bld 175 (H) 70 - 99 mg/dL    Comment: Glucose reference range applies only to samples taken after fasting for at least 8 hours.   BUN 12 8 - 23 mg/dL   Creatinine, Ser 1.31 (H) 0.44 - 1.00 mg/dL   Calcium 9.1 8.9 - 10.3 mg/dL   GFR, Estimated 44 (L) >60 mL/min    Comment: (NOTE) Calculated using the CKD-EPI Creatinine Equation (2021)    Anion gap 7 5 - 15    Comment: Performed at [facility], [address] [place], [place] [address]  Magnesium     Status: None   Collection Time: 07/26/22  5:07 AM  Result Value Ref Range   Magnesium 2.3 1.7 - 2.4 mg/dL    Comment: Performed at [facility], [address], [place], [place] [address]  Basic metabolic panel     Status: Abnormal   Collection Time: 07/27/22  6:41 AM  Result Value Ref Range   Sodium 136 135 - 145 mmol/L   Potassium 3.5 3.5 - 5.1 mmol/L   Chloride 105 98 - 111 mmol/L    CO2 22 22 - 32 mmol/L   Glucose, Bld 171 (H) 70 - 99 mg/dL    Comment: Glucose reference range applies only to samples taken after fasting for at least 8 hours.   BUN 10 8 - 23 mg/dL   Creatinine, Ser 1.08 (H) 0.44 - 1.00 mg/dL  Calcium 9.2 8.9 - 10.3 mg/dL   GFR, Estimated 55 (L) >60 mL/min    Comment: (NOTE) Calculated using the CKD-EPI Creatinine Equation (2021)    Anion gap 9 5 - 15    Comment: Performed at [facility], [address] [place], [place] [address]  CBC     Status: None   Collection Time: 07/27/22  6:41 AM  Result Value Ref Range   WBC 5.5 4.0 - 10.5 K/uL   RBC 4.65 3.87 - 5.11 MIL/uL   Hemoglobin 13.1 12.0 - 15.0 g/dL   HCT 40.0 36.0 - 46.0 %   MCV 86.0 80.0 - 100.0 fL   MCH 28.2 26.0 - 34.0 pg   MCHC 32.8 30.0 - 36.0 g/dL   RDW 15.1 11.5 - 15.5 %   Platelets 281 150 - 400 K/uL   nRBC 0.0 0.0 - 0.2 %    Comment: Performed at [facility], [address] [place], [place] [address]  Vitamin B12     Status: Abnormal   Collection Time: 07/27/22 10:23 AM  Result Value Ref Range   Vitamin B-12 1,233 (H) 180 - 914 pg/mL    Comment: (NOTE) This assay is not validated for testing neonatal or myeloproliferative syndrome specimens for Vitamin B12 levels. Performed at [facility], [place] [address], [place], [place] [address]     Current Facility-Administered Medications  Medication Dose Route Frequency Provider Last Rate Last Admin   acetaminophen (TYLENOL) tablet 650 mg  650 mg Oral Q6H PRN Para Skeans, MD       Or   acetaminophen (TYLENOL) suppository 650 mg  650 mg Rectal Q6H PRN Para Skeans, MD       amLODipine (NORVASC) tablet 5 mg  5 mg Oral Daily Max Sane, MD   5 mg at 07/27/22 0956   [START ON 07/28/2022] cholecalciferol (VITAMIN D3) 25 MCG (1000 UNIT) tablet 1,000 Units  1,000 Units Oral Daily Manuella Ghazi, [place], MD       docusate sodium (COLACE) capsule 100 mg  100 mg Oral BID PRN Max Sane, MD       feeding supplement (BOOST  / RESOURCE BREEZE) liquid 1 Container  1 Container Oral TID BM Max Sane, MD   1 Container at 07/27/22 1605   hydrALAZINE (APRESOLINE) injection 10 mg  10 mg Intravenous Q6H PRN Para Skeans, MD   10 mg at 07/26/22 2026   HYDROcodone-acetaminophen (NORCO/VICODIN) 5-325 MG per tablet 1 tablet  1 tablet Oral Q6H PRN Max Sane, MD   1 tablet at 07/27/22 1614   levothyroxine (SYNTHROID) tablet 100 mcg  100 mcg Oral Q0600 Max Sane, MD   100 mcg at 07/27/22 0609   losartan (COZAAR) tablet 100 mg  100 mg Oral Daily Florina Ou V, MD   100 mg at 07/27/22 0956   metoprolol succinate (TOPROL-XL) 24 hr tablet 50 mg  50 mg Oral Daily Florina Ou V, MD   50 mg at 07/27/22 0956   morphine (PF) 2 MG/ML injection 2 mg  2 mg Intravenous Q2H PRN Para Skeans, MD   2 mg at 07/27/22 1614   multivitamin with minerals tablet 1 tablet  1 tablet Oral Daily Max Sane, MD   1 tablet at 07/27/22 0955   ondansetron (ZOFRAN) tablet 4 mg  4 mg Oral Q6H PRN Para Skeans, MD       Or   ondansetron [facility]) injection 4 mg  4 mg Intravenous Q6H PRN  Para Skeans, MD   4 mg at 07/24/22 6578   pantoprazole (PROTONIX) EC tablet 40 mg  40 mg Oral BID AC Max Sane, MD   40 mg at 07/27/22 1605   polyethylene glycol (MIRALAX / GLYCOLAX) packet 17 g  17 g Oral Daily PRN Max Sane, MD       potassium chloride SA (KLOR-CON M) CR tablet 20 mEq  20 mEq Oral BID Max Sane, MD   20 mEq at 07/27/22 4696   senna (SENOKOT) tablet 17.2 mg  2 tablet Oral QHS PRN Max Sane, MD       topiramate (TOPAMAX) tablet 25 mg  25 mg Oral Daily Para Skeans, MD   25 mg at 07/27/22 2952    Musculoskeletal: Strength & Muscle Tone: within normal limits Gait & Station: normal Patient leans: N/A            Psychiatric Specialty Exam:  Presentation  General Appearance: No data recorded Eye Contact:No data recorded Speech:No data recorded Speech Volume:No data recorded Handedness:No data recorded  Mood and Affect  Mood:No  data recorded Affect:No data recorded  Thought Process  Thought Processes:No data recorded Descriptions of Associations:No data recorded Orientation:No data recorded Thought Content:No data recorded History of Schizophrenia/Schizoaffective disorder:No data recorded Duration of Psychotic Symptoms:No data recorded Hallucinations:No data recorded Ideas of Reference:No data recorded Suicidal Thoughts:No data recorded Homicidal Thoughts:No data recorded  Sensorium  Memory:No data recorded Judgment:No data recorded Insight:No data recorded  Executive Functions  Concentration:No data recorded Attention Span:No data recorded Recall:No data recorded Fund of Knowledge:No data recorded Language:No data recorded  Psychomotor Activity  Psychomotor Activity:No data recorded  Assets  Assets:No data recorded  Sleep  Sleep:No data recorded  Physical Exam: Physical Exam Vitals and nursing note reviewed.  Constitutional:      Appearance: Normal appearance.  HENT:     Head: Normocephalic and atraumatic.     Mouth/Throat:     Pharynx: Oropharynx is clear.  Eyes:     Pupils: Pupils are equal, round, and reactive to light.  Cardiovascular:     Rate and Rhythm: Normal rate and regular rhythm.  Pulmonary:     Effort: Pulmonary effort is normal.     Breath sounds: Normal breath sounds.  Abdominal:     General: Abdomen is flat.     Palpations: Abdomen is soft.  Musculoskeletal:        General: Normal range of motion.  Skin:    General: Skin is warm and dry.  Neurological:     General: No focal deficit present.     Mental Status: She is alert. Mental status is at baseline.  Psychiatric:        Attention and Perception: Attention normal.        Mood and Affect: Mood is depressed.        Speech: Speech normal.        Behavior: Behavior normal.        Thought Content: Thought content normal.        Cognition and Memory: Cognition normal.    Review of Systems  Constitutional:  Negative.   HENT: Negative.    Eyes: Negative.   Respiratory: Negative.    Cardiovascular: Negative.   Gastrointestinal: Negative.   Musculoskeletal:  Positive for myalgias.  Skin: Negative.   Neurological: Negative.   Psychiatric/Behavioral:  Positive for depression and suicidal ideas. Negative for hallucinations and substance abuse. The patient has insomnia.    Blood pressure (!) 159/88, pulse  65, temperature 98.2 F (36.8 C), temperature source Oral, resp. rate 14, height '5\' 7"'$  (1.702 m), weight 66.2 kg, SpO2 100 %. Body mass index is 22.87 kg/m.  Treatment Plan Summary: Plan supportive counseling and therapy.  Discussed medication.  Patient was agreeable to starting Lexapro first to just 5 mg a day.  Follow up as needed  Disposition: Patient does not meet criteria for psychiatric inpatient admission. Supportive therapy provided about ongoing stressors.  Alethia Berthold, MD 07/27/2022 6:03 PM

## 2022-07-27 NOTE — Progress Notes (Signed)
Occupational Therapy Treatment Patient Details Name: Sara Pittman MRN: [id number] DOB: 01/07/1951 Today's Date: 07/27/2022   History of present illness Pt is a 71 y/o F admitted on 07/23/22 after presenting with c/o BLE weakness for the past few days, falling twice, as well as nausea. Pt's BLE weakness is suspected 2/2 electrolyte abnormality. PMH: DM, GERD, heart murmur, HTN (Simultaneous filing. User may not have seen previous data.)   OT comments  Pt seen for OT tx this date. Pt endorsing new LUE deficits. With assessment, pt demo's decreased LUE strength, coordination, and sensation compared to RUE. 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 Pt endorses difficulty holding items with L hand. Pt encouraged to brush her teeth using her L non-dom hand as therapeutic activity. Pt able to initiate and applies toothpaste to brush and starts brushing her teeth, initially missing her mouth and hitting her chin when trying to bring the toothbrush to her mouth. She finishes brushing with R hand for thoroughness. Pt instructed in [facility] activities/exercises and functional tasks to incorporate more LUE involvement to maximize return to PLOF. Pt demo's understanding. Care team notified. Pt will continue to benefit from skilled OT Services to maximize return to PLOF.    Recommendations for follow up therapy are one component of a multi-disciplinary discharge planning process, led by the attending physician.  Recommendations may be updated based on patient status, additional functional criteria and insurance authorization.    Follow Up Recommendations  Skilled nursing-short term rehab (<3 hours/day)    Assistance Recommended at Discharge Intermittent Supervision/Assistance  Patient can return home with the following  A lot of help with walking and/or transfers;A lot of help with bathing/dressing/bathroom   Equipment Recommendations  BSC/3in1     Recommendations for Other Services      Precautions / Restrictions Precautions Precautions: Fall Restrictions Weight Bearing Restrictions: No       Mobility Bed Mobility                    Transfers                         Balance                                           ADL either performed or assessed with clinical judgement   ADL Overall ADL's : Needs assistance/impaired     Grooming: Sitting;Set up;Oral care Grooming Details (indicate cue type and reason): Pt encouraged to brush her teeth using her L non-dom hand. Pt able to initiate and applies toothpaste to brush and start brushing her teeth, initially missing her mouth and hitting her chin when trying to bring the toothbrush to her mouth. She finishes brushing with R hand for thoroughness.                                    Extremity/Trunk Assessment Upper Extremity Assessment Upper Extremity Assessment: LUE deficits/detail;Generalized weakness LUE Deficits / Details: grossly 3+/5, decr FMC with functional tasks, finger to nose, and thumb opposition testing. Decr sensation and difficulty with double extinction in L hand (but able to complete correctly with forearms) LUE Sensation: decreased light touch;decreased proprioception LUE Coordination: decreased fine motor;decreased gross motor  Lower Extremity Assessment Lower Extremity Assessment: Generalized weakness        Vision       Perception     Praxis      Cognition Arousal/Alertness: Awake/alert Behavior During Therapy: WFL for tasks assessed/performed Overall Cognitive Status: Within Functional Limits for tasks assessed                                          Exercises Other Exercises Other Exercises: Pt instructed in [facility] for L hand and functional activities to complete using non-dom L hand to support improved [place] and strength    Shoulder Instructions       General  Comments      Pertinent Vitals/ Pain       Pain Assessment Pain Assessment: No/denies pain  Home Living                                          Prior Functioning/Environment              Frequency  Min 2X/week        Progress Toward Goals  OT Goals(current goals can now be found in the care plan section)  Progress towards OT goals: OT to reassess next treatment  Acute Rehab OT Goals Patient Stated Goal: get stronger OT Goal Formulation: With patient Potential to Achieve Goals: Good  Plan Discharge plan remains appropriate;Frequency remains appropriate    Co-evaluation                 AM-PAC OT "6 Clicks" Daily Activity     Outcome Measure   Help from another person eating meals?: None Help from another person taking care of personal grooming?: A Little Help from another person toileting, which includes using toliet, bedpan, or urinal?: A Lot     Help from another person to put on and taking off regular lower body clothing?: A Lot 6 Click Score: 11    End of Session    OT Visit Diagnosis: Repeated falls (R29.6);Muscle weakness (generalized) (M62.81)   Activity Tolerance Patient tolerated treatment well   Patient Left in bed;with call bell/phone within reach;with bed alarm set;with family/visitor present   Nurse Communication Other (comment) (LUE changes)        Time: 3559-7416 OT Time Calculation (min): 14 min  Charges: OT General Charges $OT Visit: 1 Visit OT Treatments $Therapeutic Activity: 8-22 mins  Ardeth Perfect., MPH, MS, OTR/L ascom [phone] 07/27/22, 11:36 AM

## 2022-07-27 NOTE — Progress Notes (Signed)
PHARMACY CONSULT NOTE - FOLLOW UP  Pharmacy Consult for Electrolyte Monitoring and Replacement   Recent Labs: Potassium (mmol/L)  Date Value  07/27/2022 3.5   Magnesium (mg/dL)  Date Value  07/26/2022 2.3   Calcium (mg/dL)  Date Value  07/27/2022 9.2   Albumin (g/dL)  Date Value  07/23/2022 4.7   Phosphorus (mg/dL)  Date Value  07/24/2022 4.1   Sodium (mmol/L)  Date Value  07/27/2022 136    Assessment: Patient with PMH relevant for melanoma undergoing immunotherapy, hypothyroidism, DM, and GERD. Pharmacy consulted to manage electrolytes.  Goal of Therapy:  Electrolytes WNL  Plan:  K improved from 2.9 to 3.5, now on oral Kcl '20mg'$  BID. No additional supplementation today. Will follow up AM labs and replace as needed.  Rosiland Sen Rodriguez-Guzman PharmD, BCPS 07/27/2022 8:08 AM

## 2022-07-27 NOTE — Progress Notes (Signed)
22:08 Pt reports 10/10 left arm and left leg pain; given Hydrocodone PRN.  02:00 am: Pt reports 10/10 bilateral leg pain. Reports that Hydrocodone barely touched her pain, and that it was previously prescribed to her for eye pain. Pt believes Hydrocodone does not work for her leg pain. Given IV morphine prn. SCD also applied.   06:00 am: Pt denies pain. Reports SCD and IV pain medicine helped.

## 2022-07-27 NOTE — Progress Notes (Signed)
  Progress Note   Patient: Sara Pittman KWI:[id number] DOB: 08/26/1951 DOA: 07/23/2022     2 DOS: the patient was seen and examined on 07/27/2022   Brief hospital course: 71 year old female with a known history of stage IV UVL melanoma undergoing immunotherapy at [place] with Dr. Rico Ala, oncology is admitted for lower extremity weakness  9/30: Still feeling weak, nausea 10/1: Hypokalemia.  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  Neuro consult    Assessment and Plan: * Severe major depression, single episode, without psychotic features ([place]) Seen by psychiatry.  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  Further evaluation with lumbar puncture and CSF studies  Hypothyroidism Elevated TSH at 24.62.  Increase dose of Synthroid from 75 to 100 mcg.  Hypercalcemia Now normalized status post IV hydration.   GERD (gastroesophageal reflux disease) Continue PPI  Nausea & vomiting Related to GERD/ suspect underlying esophagitis. Continue Protonix.  She is tolerating diet and not having any vomiting  Leg weakness, bilateral Patient had severe 10 out of 10 pain in the legs last evening/night relieved by morphine and today on PT, OT assessment was found to have new right upper extremity weakness and sensory deficits as she was dropping cup in the hand.  MRI of the brain and lumbar spine negative for acute pathology.   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 per neurology evaluation - will get LP tomorrow    DM II (diabetes mellitus, type II), controlled ([place]) Well-controlled for now        Subjective: Had severe leg pain last night relieved by morphine, nauseous Now agreeable to go to rehab   Physical Exam: Vitals:   07/27/22 0347 07/27/22 0812 07/27/22 1559 07/27/22 1957  BP: (!) 160/80 (!) 165/84 (!) 159/88 (!) 148/85  Pulse:  (!) 56 (!) 58 65 72  Resp: '18 14 14 16  '$ Temp: 98.1 F (36.7 C) 98.5 F (36.9 C) 98.2 F (36.8 C) 98.3 F (36.8 C)  TempSrc:  Oral Oral   SpO2: 98% 97% 100% 100%  Weight:      Height:       71 year old female lying in the bed comfortably without any acute distress Lungs clear to auscultation bilaterally Cardiovascular regular rate and rhythm Abdomen soft, benign Neuro alert and awake, left lower extremity weaker than right, sensory deficit lower extremity also noted Skin no rash or lesion Psych depressed mood   Data Reviewed:  MRI of the brain showed no acute pathology  Family Communication: None  Disposition: Status is: Inpatient Remains inpatient appropriate because: Further neurological evaluation for GBS including LP tomorrow   Planned Discharge Destination: Skilled nursing facility    holding Lovenox and aspirin for LP tomorrow Time spent: 35 minutes  Author: Max Sane, MD 07/27/2022 8:23 PM  For on call review [email or web address].

## 2022-07-27 NOTE — Assessment & Plan Note (Signed)
Patient had severe 10 out of 10 pain in the legs last evening/night relieved by morphine and today on PT, OT assessment was found to have new right upper extremity weakness and sensory deficits as she was dropping cup in the hand.  MRI of the brain and lumbar spine negative for acute pathology.   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

## 2022-07-27 NOTE — TOC Progression Note (Signed)
Transition of Care [facility]) - Progression Note    Patient Details  Name: ELISA KUTNER MRN: [id number] Date of Birth: [birth year]  Transition of Care [facility]) CM/SW Contact  Beverly Sessions, RN Phone Number: 07/27/2022, 3:33 PM  Clinical Narrative:    Bed offers presented.  Patient accepts bed at [facility].  Auth started in navi portal MD updated    Expected Discharge Plan: [place] Barriers to Discharge: Continued Medical Work up, Barriers Unresolved (comment) ([place] pending)  Expected Discharge Plan and Services Expected Discharge Plan: [place] Choice: [place] arrangements for the past 2 months: Single Family Home                           HH Arranged: PT, OT           Social Determinants of Health (SDOH) Interventions    Readmission Risk Interventions     No data to display

## 2022-07-27 NOTE — Consult Note (Signed)
NEURO HOSPITALIST CONSULT NOTE   Requesting physician: Dr. Manuella Ghazi  Reason for Consult: Right arm and bilateral lower extremity weakness  History obtained from:   Patient and Chart     HPI:                                                                                                                                          Sara Pittman is an 71 y.o. female with a PMHx of DM, HTN, hypothyroidism, heart murmur, stage IV UVL melanoma undergoing immunotherapy at [place] who presented to the [facility] ED on Friday with new onset of BLE weakness that had been ongoing for the past few days, resulting in two falls. Her symptoms started roughly a week PTA, about 2-2.5 months after the patient received her immunotherapy. Weakness progressively worsened. She described feeling like someone kicks her legs out from under her. She had associated low back pain with the weakness. She denied any change to urination or defecation. There was also decreased sensation in her lower extremities. She was complaining of nausea at the time of presentation. Head CT showed no acute abnormality.    Initial PT evaluation revealed proximal > distal lower extremity weakness as well as decreased sensation to light touch to her feet bilaterally. She required MAX assist for STS from recliner with use of RW.   MRI of her lumbar spine showed no acute pathology. DDx per Hospitalist note included generalized weakness as side effects of immunotherapy, with other contributing factors being low magnesium. She has had other electrolyte derangements this admission including hypokalemia and hypercalcemia, which were corrected without improvement in her strength. Overnight, she experienced 10/10 leg pain bilaterally, which improved with morphine after it was noted that hydrocodone did not have a significant event.   On today's PT assessment, new right upper extremity weakness and sensory extinction were noted. Neurology was  consulted to further evaluate.   Patient's immunotherapy medications, administered IV, are ipilimumab and nivolumab. To her best recollection, she has received a total of about 3 treatments so far.   Past Medical History:  Diagnosis Date   Diabetes mellitus without complication (HCC)    Edema    feet/leg   GERD (gastroesophageal reflux disease)    Heart murmur    Hypertension    Hypothyroidism     Past Surgical History:  Procedure Laterality Date   ABDOMINAL HYSTERECTOMY     AMPUTATION     tip of finger   BIOPSY THYROID     CATARACT EXTRACTION W/PHACO Right 02/16/2017   Procedure: CATARACT EXTRACTION PHACO AND INTRAOCULAR LENS PLACEMENT ([place]);  Surgeon: Birder Robson, MD;  Location: [facility] ORS;  Service: Ophthalmology;  Laterality: Right;  [place] 52.5 AP% 17.7 CDE 9.28 FLUID PACK LOT # [id number]   CATARACT EXTRACTION  W/PHACO Left 03/09/2017   Procedure: CATARACT EXTRACTION PHACO AND INTRAOCULAR LENS PLACEMENT (IOC);  Surgeon: Birder Robson, MD;  Location: [facility] ORS;  Service: Ophthalmology;  Laterality: Left;  [place] 1:02.2 AP% 19.8 CDE 12.30 Fluid Pack Lot # [id number]    Family History  Problem Relation Age of Onset   Bladder Cancer Neg Hx    Kidney cancer Neg Hx    Prostate cancer Neg Hx               Social History:  reports that she has been smoking cigarettes. She has been smoking an average of .5 packs per day. She has never used smokeless tobacco. She reports that she does not drink alcohol and does not use drugs.  Allergies  Allergen Reactions   Crestor [Rosuvastatin] Other (See Comments)    All over aches     MEDICATIONS:                                                                                                                     Prior to Admission:  Medications Prior to Admission  Medication Sig Dispense Refill Last Dose   amLODipine (NORVASC) 2.5 MG tablet Take 2.5 mg by mouth daily.   Past Week at 0930   CALCIUM-VITAMIN D PO Take 1 tablet by mouth  daily.   Past Week at 0930   Dulaglutide 1.5 MG/0.5ML SOPN Inject into the skin.   07/19/2022 at 0930   glipiZIDE (GLUCOTROL) 10 MG tablet Take 10 mg by mouth 2 (two) times daily before a meal.   Past Week at 0930   HYDROcodone-acetaminophen (NORCO/VICODIN) 5-325 MG tablet Take 1 tablet by mouth every 8 (eight) hours as needed for pain.   prn at prn   levothyroxine (SYNTHROID) 75 MCG tablet Take 75 mcg by mouth daily.   07/21/2022 at 0800   losartan (COZAAR) 100 MG tablet Take 100 mg by mouth daily.   Past Week at 0930   metFORMIN (GLUCOPHAGE) 1000 MG tablet Take 1,000 mg by mouth 2 (two) times daily with a meal.   Past Week at 0930   metoprolol succinate (TOPROL-XL) 50 MG 24 hr tablet Take 1 tablet by mouth daily.   Past Week at 0930   omeprazole (PRILOSEC) 20 MG capsule Take 20 mg by mouth 2 (two) times daily before a meal.   Past Week at 0930   simvastatin (ZOCOR) 80 MG tablet Take 80 mg by mouth at bedtime.   Past Week at unknown   traMADol (ULTRAM) 50 MG tablet Take 50 mg by mouth every 6 (six) hours as needed for pain.   prn at prn   aspirin EC 81 MG tablet Take 81 mg by mouth daily. (Patient not taking: Reported on 07/24/2022)   Not Taking   cholecalciferol (VITAMIN D) 1000 units tablet Take 1,000 Units by mouth daily. (Patient not taking: Reported on 07/24/2022)   Not Taking   topiramate (TOPAMAX) 25 MG tablet Take by mouth.      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 Numbness below hips bilaterally. No SOB. Other ROS as per HPI. She is not complaining of any other symptoms at the time of Neurology evaluation.    Blood pressure (!) 165/84, pulse (!) 58, temperature 98.5 F (36.9 C), temperature source Oral, resp. rate 14, height '5\' 7"'$  (1.702 m), weight 66.2 kg, SpO2 97 %.   General Examination:                                                                                                       Physical Exam  HEENT-  [place]/AT   Lungs- Respirations unlabored.  Extremities- No edema  Neurological Examination Mental Status: Awake and alert. Fully oriented. Thought content appropriate. Speech fluent without evidence of aphasia.  Able to follow all commands without difficulty. 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 Visual fields intact OS. Visual acuity decreased in left eye without spectacles. Right pupil 4 mm, irregular and unreactive. Left pupil 3 mm, round and reactive.  III,IV, VI: Mild right ptosis. No nystagmus. EOMI.  V: Temp sensation equal bilaterally VII: Smile mildly asymmetric while speaking but with purposeful grimace and cheek puff perioral muscles contract equally VIII: Hearing intact to voice IX,X: No hypophonia or hoarseness XI: Symmetric XII: Midline tongue extension Motor: RUE 4/5 deltoid, triceps, biceps and grip LUE 4-/5 deltoid, triceps, biceps and grip RLE 2/5 hip flexion, hip abduction and hip internal rotation; 3/5 knee extension, 2/5 knee flexion, 4-/5 APF/ADF LLE 1-2/5 hip flexion, hip abduction and hip internal rotation; 1-2/5 knee extension, 1-2/5 knee flexion, 4-/5 APF/ADF Sensory: Decreased temp sensation and dysesthesia to temp circumferentially to BLE with sensory level at T10 on the left and L1 on the right. Also noted is severe proprioceptive  deficits to her toes.  Deep Tendon Reflexes: Absent achilles and patellar reflexes bilaterally. Trace biceps, triceps and brachioradialis bilaterally. Toes  downgoing.  Cerebellar: Mild ataxia with FNF on the left. No ataxia on the right. Unable to assess H-S due to weakness.  Gait: Unable to assess  Lab Results: Basic Metabolic Panel: Recent Labs  Lab 07/23/22 1303 07/24/22 0557 07/25/22 1354 07/25/22 1405 07/26/22 0507 07/27/22 0641  NA 137  --   --  137 140 136  K 3.7  --   --  3.1* 2.9* 3.5  CL 98  --   --  104 108 105  CO2 19*  --   --  '24 25 22  '$ GLUCOSE 142*  --   --  167* 175* 171*  BUN 12  --   --  '16 12 10  '$ CREATININE 0.89  --   --  1.50* 1.31* 1.08*  CALCIUM 11.3*  --   --  9.0 9.1 9.2  MG  --  1.6* 2.2  --  2.3  --   PHOS  --  4.1  --   --   --   --     CBC: Recent Labs  Lab 07/23/22 1303 07/26/22 0507 07/27/22 0641  WBC 7.6 5.5 5.5  NEUTROABS 6.2  --   --   HGB 14.8 12.5 13.1  HCT 44.7 37.5 40.0  MCV 83.7 83.1 86.0  PLT 384 301 281    Cardiac Enzymes: Recent Labs  Lab 07/24/22 0557  CKTOTAL 199    Lipid Panel: No results for input(s): "CHOL", "TRIG", "HDL", "CHOLHDL", "VLDL", "LDLCALC" in the last 168 hours.  Imaging: No results found.   Assessment: 71 year old female with metastatic melanoma, presenting with progressive BLE weakness.  - Exam reveals motor findings best localizable to the lumbar nerve roots/cauda equina, most likely due to a motor neuropathy. Exam also reveals sensory findings best localizable to the lumbar nerve roots/cauda equina/lower extremity peripheral nerves in both a small and large fiber distribution. 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  - CT head: Mild age related cerebral atrophy but no acute intracranial findings or mass lesions - MRI L-spine: No spinal canal stenosis or neural foraminal narrowing. Multilevel facet arthropathy, which can be a cause of back pain. - MRI brain: No acute or reversible finding. Mild age related volume loss. Minimal chronic small-vessel change of  the hemispheric white matter. Single punctate focus of hemosiderin deposition in the left frontal white matter, not significant as an isolated finding. No sign of metastatic melanoma.  - Vitamin E alpha and gamma: Normal - Phosphorus normal.  - Mg low on admission at 1.6. Now corrected without improvement of her weakness.  - Vitamin B12 was low on admission at 291, then artificially elevated due to supplementation.  - CK level is normal. Statin myopathy unlikely.  - UDS only positive for opiates.  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 She will need an LP.   - Lower on DDx is melanoma seeding to spinal nerve roots  - DDx also includes chronic B12 deficiency neuropathy given low B12 on admission. - Myelin oligodendrocyte glycoprotein antibody-associated disorder is also a consideration - Literature search reveals that ipilimumab and nivolumab (immune checkpoint inhibitors) have been associated with development of GBS.   - A portion of her exam findings may be due to longstanding diabetic neuropathy, but diabetes is  not felt to explain the subacute/severe component of her deficits.     Recommendations: - MRI of cervical and thoracic spine with and without contrast. Also will need lumbar spine post-contrast add-on study (ordered) - Methylmalonic acid level (ordered) - Fluoro-guided LP. Obtain the following labs: - Protein, glucose, cell count with differential, IgG index (ordered) - CSF sample will also need to be run for cytology. Will need an additional 10 cc of sample for this test (ordered) - She is on SQ heparin, therefore would hold tonight's dose and get LP tomorrow.  - Holding ASA for the procedure as well. Can restart after LP.  - Serum anti-MOG antibody (ordered) - Serum anti-GQ1b antibody (ordered) - Will need to contact her Oncologist regarding possible ipilimumab or nivolumab triggered GBS   Electronically signed: Dr. Kerney Elbe 07/27/2022, 11:41 AM

## 2022-07-27 NOTE — Assessment & Plan Note (Signed)
Now normalized status post IV hydration.

## 2022-07-27 NOTE — Assessment & Plan Note (Signed)
Related to GERD/ suspect underlying esophagitis. Continue Protonix.  She is tolerating diet and not having any vomiting

## 2022-07-27 NOTE — Assessment & Plan Note (Signed)
Could be due to GBS.  Further evaluation with lumbar puncture and CSF studies

## 2022-07-28 ENCOUNTER — Inpatient Hospital Stay: Payer: Medicare Other

## 2022-07-28 DIAGNOSIS — C439 Malignant melanoma of skin, unspecified: Secondary | ICD-10-CM | POA: Diagnosis not present

## 2022-07-28 DIAGNOSIS — R262 Difficulty in walking, not elsewhere classified: Secondary | ICD-10-CM | POA: Diagnosis not present

## 2022-07-28 DIAGNOSIS — F322 Major depressive disorder, single episode, severe without psychotic features: Secondary | ICD-10-CM | POA: Diagnosis not present

## 2022-07-28 DIAGNOSIS — G8250 Quadriplegia, unspecified: Secondary | ICD-10-CM | POA: Diagnosis not present

## 2022-07-28 DIAGNOSIS — G96198 Other disorders of meninges, not elsewhere classified: Secondary | ICD-10-CM | POA: Diagnosis not present

## 2022-07-28 DIAGNOSIS — R112 Nausea with vomiting, unspecified: Secondary | ICD-10-CM | POA: Diagnosis not present

## 2022-07-28 DIAGNOSIS — G610 Guillain-Barre syndrome: Secondary | ICD-10-CM | POA: Diagnosis not present

## 2022-07-28 LAB — MENINGITIS/ENCEPHALITIS PANEL (CSF)

## 2022-07-28 LAB — CBC
HCT: 40.1 % (ref 36.0–46.0)
Hemoglobin: 13.1 g/dL (ref 12.0–15.0)
MCH: 27.8 pg (ref 26.0–34.0)
MCHC: 32.7 g/dL (ref 30.0–36.0)
MCV: 85.1 fL (ref 80.0–100.0)
Platelets: 326 10*3/uL (ref 150–400)
RBC: 4.71 MIL/uL (ref 3.87–5.11)
RDW: 14.9 % (ref 11.5–15.5)
WBC: 5.7 10*3/uL (ref 4.0–10.5)
nRBC: 0 % (ref 0.0–0.2)

## 2022-07-28 LAB — CSF CELL COUNT WITH DIFFERENTIAL
Eosinophils, CSF: 0 % (ref 0–1)
Lymphs, CSF: 98 % — ABNORMAL HIGH (ref 40–80)
Monocyte-Macrophage-Spinal Fluid: 2 % — ABNORMAL LOW (ref 15–45)
RBC Count, CSF: 55 /mm3 — ABNORMAL HIGH
Segmented Neutrophils-CSF: 0 % (ref 0–6)
Tube #: 3
WBC, CSF: 26 /mm3 (ref 0–5)

## 2022-07-28 LAB — GLUCOSE, CSF: Glucose, CSF: 166 mg/dL — ABNORMAL HIGH (ref 40–70)

## 2022-07-28 LAB — BASIC METABOLIC PANEL
Anion gap: 7 (ref 5–15)
BUN: 15 mg/dL (ref 8–23)
CO2: 23 mmol/L (ref 22–32)
Calcium: 9.1 mg/dL (ref 8.9–10.3)
Chloride: 105 mmol/L (ref 98–111)
Creatinine, Ser: 1.13 mg/dL — ABNORMAL HIGH (ref 0.44–1.00)
GFR, Estimated: 52 mL/min — ABNORMAL LOW (ref >60)
Glucose, Bld: 241 mg/dL — ABNORMAL HIGH (ref 70–99)
Potassium: 4 mmol/L (ref 3.5–5.1)
Sodium: 135 mmol/L (ref 135–145)

## 2022-07-28 LAB — PROTEIN, CSF: Total  Protein, CSF: 373 mg/dL — ABNORMAL HIGH (ref 15–45)

## 2022-07-28 LAB — PATHOLOGIST SMEAR REVIEW

## 2022-07-28 LAB — VITAMIN C: Vitamin C: 0.2 mg/dL — ABNORMAL LOW (ref 0.4–2.0)

## 2022-07-28 LAB — VITAMIN B6: Vitamin B6: 5.8 ug/L (ref 3.4–65.2)

## 2022-07-28 MED ORDER — ENOXAPARIN SODIUM 40 MG/0.4ML IJ SOSY
40.0000 mg | PREFILLED_SYRINGE | INTRAMUSCULAR | Status: DC
  Administered 2022-07-28 – 2022-07-29 (×2): 40 mg via SUBCUTANEOUS
  Filled 2022-07-28 (×2): qty 0.4

## 2022-07-28 MED ORDER — LIDOCAINE HCL (PF) 1 % IJ SOLN: 5.0000 mL | Freq: Once | INTRAMUSCULAR | Status: DC

## 2022-07-28 NOTE — Assessment & Plan Note (Signed)
Patient presented with bilateral lower extremity weakness and started having progressive weakness, new onset numbness and areflexia in the lower extremity and hyperreflexia in the upper extremities.  She also has numbness in the left upper extremity in her lower extremities  MRI of the brain and lumbar spine did not show any acute pathology  -Discussed with neurology -her findings are concerning for GBS and or polyradiculoneuropathy -S/p IR guided lumbar puncture.  CSF studies pending.  She does have elevated WBC in the CSF for which ID consult is requested -I have requested meningitis/encephalitis panel and CSF as add-on per ID request -Her differential diagnosis still remains very broad -cannot rule out immunotherapy related GBS - Appreciate neuro input

## 2022-07-28 NOTE — Assessment & Plan Note (Signed)
Well-controlled for now

## 2022-07-28 NOTE — TOC Progression Note (Addendum)
Transition of Care [facility]) - Progression Note    Patient Details  Name: Sara Pittman MRN: [id number] Date of Birth: 27-Feb-1951  Transition of Care [facility]) CM/SW Erma, LCSW Phone Number: 07/28/2022, 8:51 AM  Clinical Narrative:  Josem Kaufmann approved: [id number]. Valid 10/3-10/5. Left message for admissions coordinator to notify.   10:18 am: Per MD, patient not ready for discharge yet and will likely be here a few more days.  Expected Discharge Plan: [place] Barriers to Discharge: Continued Medical Work up, Barriers Unresolved (comment) ([place] pending)  Expected Discharge Plan and Services Expected Discharge Plan: [place]     Post Acute Care Choice: [place] arrangements for the past 2 months: Single Family Home                           HH Arranged: PT, OT           Social Determinants of Health (SDOH) Interventions    Readmission Risk Interventions     No data to display

## 2022-07-28 NOTE — Assessment & Plan Note (Signed)
Elevated TSH at 24.62.  Increase dose of Synthroid from 75 to 100 mcg.

## 2022-07-28 NOTE — Assessment & Plan Note (Signed)
Continue PPI ?

## 2022-07-28 NOTE — Care Management Important Message (Signed)
Important Message  Patient Details  Name: Sara Pittman MRN: [id number] Date of Birth: [birth year]   Medicare Important Message Given:  Yes     Dannette Barbara 07/28/2022, 11:33 AM

## 2022-07-28 NOTE — Assessment & Plan Note (Signed)
Seen by psychiatry.  Lexapro started on 10/2.  No further suicidal ideation

## 2022-07-28 NOTE — Consult Note (Signed)
NAME: Sara Pittman  DOB: [birth year]  MRN: [id number]  Date/Time: 07/28/2022 4:26 PM  REQUESTING PROVIDER: Dr Manuella Ghazi Subjective:  REASON FOR CONSULT: Guillain barre syndrome ? Sara Pittman is a 71 y.o. female with a history of Class II uveal melanoma , metastatic lung disease ( bronch on 03/26/22 ) started dual PD1 inhibitor on 04/30/22- received three cycles - last one on 07/17/22 ( ipilimumab and nivolumab) Presenting on 07/23/22 with  numbness and weakness legs  which has been gradually progressing over 2 weeks As per patient she tod her oncologist that she was having numbness of her legs and thought it was the immunotherapy meds . She then received te 3rd dose on 07/17/22 and felt the symptoms worsened after that. She had no fever No difficulty passing urine She also some pain/numbness arms No difficulty in breathing Vitals  07/23/22 12:00  BP 134/92 (H)  Temp 98.6 F (37 C)  Pulse Rate 103 !  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 Pt was seen by neurologist  Pt underwent LP  Latest Reference Range & Units 07/28/22 10:33  Appearance, CSF CLEAR  CLEAR  Glucose, CSF 40 - 70 mg/dL 166 (H)  RBC Count, CSF 0 /cu mm 55 (H)  WBC, CSF 0 - 5 /cu mm 26 (HH)  Segmented Neutrophils-CSF 0 - 6 % 0  Lymphs, CSF 40 - 80 % 98 (H)  Monocyte-Macrophage-Spinal Fluid 15 - 45 % 2 (L)  Eosinophils, CSF 0 - 1 % 0  Color, CSF COLORLESS  STRAW !  Supernatant  NOT INDICATED  Total  Protein, CSF 15 - 45 mg/dL 373 (H)  Tube #  3   I am asked to see her to r/o any infectious etiology  Past Medical History:  Diagnosis Date    Diabetes mellitus without complication (HCC)    Edema    feet/leg   GERD (gastroesophageal reflux disease)    Heart murmur    Hypertension    Hypothyroidism     Past Surgical History:  Procedure Laterality Date   ABDOMINAL HYSTERECTOMY     AMPUTATION     tip of finger   BIOPSY THYROID     CATARACT EXTRACTION W/PHACO Right 02/16/2017   Procedure: CATARACT EXTRACTION PHACO AND INTRAOCULAR LENS PLACEMENT ([place]);  Surgeon: Birder Robson, MD;  Location: [facility] ORS;  Service: Ophthalmology;  Laterality: Right;  [place] 52.5 AP% 17.7 CDE 9.28 FLUID PACK LOT # [id number]   CATARACT EXTRACTION W/PHACO Left 03/09/2017   Procedure: CATARACT EXTRACTION PHACO AND INTRAOCULAR LENS PLACEMENT (I[facility]);  Surgeon: Birder Robson, MD;  Location: [facility] ORS;  Service: Ophthalmology;  Laterality: Left;  [place] 1:02.2 AP% 19.8 CDE 12.30 Fluid Pack Lot # [id number]    Social History   Socioeconomic History   Marital status: Single    Spouse name: Not on file   Number of children: Not on file   Years of education: Not on file   Highest education level: Not on file  Occupational History   Not on file  Tobacco Use   Smoking status: Every Day  Packs/day: 0.50    Types: Cigarettes   Smokeless tobacco: Never  Vaping Use   Vaping Use: Never used  Substance and Sexual Activity   Alcohol use: No   Drug use: No   Sexual activity: Yes    Birth control/protection: Post-menopausal, Surgical  Other Topics Concern   Not on file  Social History Narrative   Not on file   Social Determinants of Health   Financial Resource Strain: Not on file  Food Insecurity: No Food Insecurity (07/24/2022)   Hunger Vital Sign    Worried About Running Out of Food in the Last Year: Never true    Ran Out of Food in the Last Year: Never true  Transportation Needs: No Transportation Needs (07/24/2022)   PRAPARE - Hydrologist (Medical): No    Lack of Transportation (Non-Medical): No  Physical Activity:  Not on file  Stress: Not on file  Social Connections: Not on file  Intimate Partner Violence: Not At Risk (07/24/2022)   Humiliation, Afraid, Rape, and Kick questionnaire    Fear of Current or Ex-Partner: No    Emotionally Abused: No    Physically Abused: No    Sexually Abused: No    Family History  Problem Relation Age of Onset   Bladder Cancer Neg Hx    Kidney cancer Neg Hx    Prostate cancer Neg Hx    Allergies  Allergen Reactions   Crestor [Rosuvastatin] Other (See Comments)    All over aches    I? Current Facility-Administered Medications  Medication Dose Route Frequency Provider Last Rate Last Admin   acetaminophen (TYLENOL) tablet 650 mg  650 mg Oral Q6H PRN Para Skeans, MD       Or   acetaminophen (TYLENOL) suppository 650 mg  650 mg Rectal Q6H PRN Para Skeans, MD       amLODipine (NORVASC) tablet 5 mg  5 mg Oral Daily Max Sane, MD   5 mg at 07/28/22 1324   cholecalciferol (VITAMIN D3) 25 MCG (1000 UNIT) tablet 1,000 Units  1,000 Units Oral Daily Max Sane, MD   1,000 Units at 07/28/22 1324   docusate sodium (COLACE) capsule 100 mg  100 mg Oral BID PRN Max Sane, MD       enoxaparin (LOVENOX) injection 40 mg  40 mg Subcutaneous Q24H Manuella Ghazi, Vipul, MD       escitalopram (LEXAPRO) tablet 5 mg  5 mg Oral Daily Clapacs, John T, MD   5 mg at 07/28/22 1323   feeding supplement (BOOST / RESOURCE BREEZE) liquid 1 Container  1 Container Oral TID BM Max Sane, MD   1 Container at 07/27/22 2015   hydrALAZINE (APRESOLINE) injection 10 mg  10 mg Intravenous Q6H PRN Para Skeans, MD   10 mg at 07/26/22 2026   HYDROcodone-acetaminophen (NORCO/VICODIN) 5-325 MG per tablet 1 tablet  1 tablet Oral Q6H PRN Max Sane, MD   1 tablet at 07/27/22 1614   levothyroxine (SYNTHROID) tablet 100 mcg  100 mcg Oral Q0600 Max Sane, MD   100 mcg at 07/27/22 0609   lidocaine (PF) (XYLOCAINE) 1 % injection 5 mL  5 mL Intradermal Once Kerney Elbe, MD       losartan (COZAAR) tablet 100 mg   100 mg Oral Daily Florina Ou V, MD   100 mg at 07/28/22 1324   metoprolol succinate (TOPROL-XL) 24 hr tablet 50 mg  50 mg Oral Daily Para Skeans, MD  50 mg at 07/28/22 1324   morphine (PF) 2 MG/ML injection 2 mg  2 mg Intravenous Q2H PRN Para Skeans, MD   2 mg at 07/28/22 1315   multivitamin with minerals tablet 1 tablet  1 tablet Oral Daily Max Sane, MD   1 tablet at 07/28/22 1324   ondansetron (ZOFRAN) tablet 4 mg  4 mg Oral Q6H PRN Para Skeans, MD       Or   ondansetron [facility]) injection 4 mg  4 mg Intravenous Q6H PRN Para Skeans, MD   4 mg at 07/28/22 0953   pantoprazole (PROTONIX) EC tablet 40 mg  40 mg Oral BID AC Max Sane, MD   40 mg at 07/28/22 1324   polyethylene glycol (MIRALAX / GLYCOLAX) packet 17 g  17 g Oral Daily PRN Max Sane, MD       potassium chloride SA (KLOR-CON M) CR tablet 20 mEq  20 mEq Oral BID Max Sane, MD   20 mEq at 07/28/22 1324   senna (SENOKOT) tablet 17.2 mg  2 tablet Oral QHS PRN Max Sane, MD       topiramate (TOPAMAX) tablet 25 mg  25 mg Oral Daily Para Skeans, MD   25 mg at 07/28/22 1325     Abtx:  Anti-infectives (From admission, onward)    None       REVIEW OF SYSTEMS:  Const: negative fever, negative chills, negative weight loss Eyes: negative diplopia or visual changes, negative eye pain ENT: negative coryza, negative sore throat Resp: negative cough, hemoptysis, dyspnea Cards: negative for chest pain, palpitations, lower extremity edema GU: negative for frequency, dysuria and hematuria GI: Negative for abdominal pain, diarrhea, bleeding, constipation Skin: negative for rash and pruritus Heme: negative for easy bruising and gum/nose bleeding MS: weakness legs Neurolo:numbness and weakness legs Numbness arms left > rt Psych: anxiety, depression  Endocrine: thyroid, diabetes Allergy/Immunology- as above Objective:  VITALS:  BP (!) 180/91 (BP Location: Right Arm)   Pulse (!) 59   Temp 98.4 F (36.9 C) (Oral)    Resp 16   Ht '5\' 7"'$  (1.702 m)   Wt 66.2 kg   SpO2 98%   BMI 22.87 kg/m   PHYSICAL EXAM:  General: Alert, upset.  Head: Normocephalic, without obvious abnormality, atraumatic. Eyes: Conjunctivae clear, anicteric sclerae. Pupils are equal ENT Nares normal. No drainage or sinus tenderness. Lips, mucosa, and tongue normal. No Thrush Neck: Supple, symmetrical, no adenopathy, thyroid: non tender no carotid bruit and no JVD. Back: No CVA tenderness. Lungs: Clear to auscultation bilaterally. No Wheezing or Rhonchi. No rales. Heart: Regular rate and rhythm, no murmur, rub or gallop. Abdomen: Soft, non-tender,not distended. Bowel sounds normal. No masses Extremities: atraumatic, no cyanosis. No edema. No clubbing Skin: No rashes or lesions. Or bruising Lymph: Cervical, supraclavicular normal. Neurologic: lower limbs decreased sensation to touch upto upper thighs Plantar equivocal   Cannot test power as she is not cooperative Rt eye blind  Pertinent Labs Lab Results CBC    Component Value Date/Time   WBC 5.7 07/28/2022 0550   RBC 4.71 07/28/2022 0550   HGB 13.1 07/28/2022 0550   HCT 40.1 07/28/2022 0550   PLT 326 07/28/2022 0550   MCV 85.1 07/28/2022 0550   MCH 27.8 07/28/2022 0550   MCHC 32.7 07/28/2022 0550   RDW 14.9 07/28/2022 0550   LYMPHSABS 1.0 07/23/2022 1303   MONOABS 0.3 07/23/2022 1303   EOSABS 0.0 07/23/2022 1303   BASOSABS 0.0 07/23/2022 1303  Latest Ref Rng & Units 07/28/2022    5:50 AM 07/27/2022    6:41 AM 07/26/2022    5:07 AM  CMP  Glucose 70 - 99 mg/dL 241  171  175   BUN 8 - 23 mg/dL '15  10  12   '$ Creatinine 0.44 - 1.00 mg/dL 1.13  1.08  1.31   Sodium 135 - 145 mmol/L 135  136  140   Potassium 3.5 - 5.1 mmol/L 4.0  3.5  2.9   Chloride 98 - 111 mmol/L 105  105  108   CO2 22 - 32 mmol/L '23  22  25   '$ Calcium 8.9 - 10.3 mg/dL 9.1  9.2  9.1       Microbiology: Recent Results (from the past 240 hour(s))  Resp Panel by RT-PCR (Flu A&B, Covid)  Anterior Nasal Swab     Status: None   Collection Time: 07/23/22 12:06 PM   Specimen: Anterior Nasal Swab  Result Value Ref Range Status   SARS Coronavirus 2 by RT PCR NEGATIVE NEGATIVE Final    Comment: (NOTE) SARS-CoV-2 target nucleic acids are NOT DETECTED.  The SARS-CoV-2 RNA is generally detectable in upper respiratory specimens during the acute phase of infection. The lowest concentration of SARS-CoV-2 viral copies this assay can detect is 138 copies/mL. A negative result does not preclude SARS-Cov-2 infection and should not be used as the sole basis for treatment or other patient management decisions. A negative result may occur with  improper specimen collection/handling, submission of specimen other than nasopharyngeal swab, presence of viral mutation(s) within the areas targeted by this assay, and inadequate number of viral copies(<138 copies/mL). A negative result must be combined with clinical observations, patient history, and epidemiological information. The expected result is Negative.  Fact Sheet for Patients:  EntrepreneurPulse.com.au  Fact Sheet for Healthcare Providers:  IncredibleEmployment.be  This test is no t yet approved or cleared by the [place] FDA and  has been authorized for detection and/or diagnosis of SARS-CoV-2 by FDA under an Emergency Use Authorization (EUA). This EUA will remain  in effect (meaning this test can be used) for the duration of the COVID-19 declaration under Section 564(b)(1) of the Act, 21 U.S.C.section 360bbb-3(b)(1), unless the authorization is terminated  or revoked sooner.       Influenza A by PCR NEGATIVE NEGATIVE Final   Influenza B by PCR NEGATIVE NEGATIVE Final    Comment: (NOTE) The Xpert Xpress SARS-CoV-2/FLU/RSV plus assay is intended as an aid in the diagnosis of influenza from Nasopharyngeal swab specimens and should not be used as a sole basis for treatment. Nasal washings  and aspirates are unacceptable for Xpert Xpress SARS-CoV-2/FLU/RSV testing.  Fact Sheet for Patients: EntrepreneurPulse.com.au  Fact Sheet for Healthcare Providers: IncredibleEmployment.be  This test is not yet approved or cleared by the [place] FDA and has been authorized for detection and/or diagnosis of SARS-CoV-2 by FDA under an Emergency Use Authorization (EUA). This EUA will remain in effect (meaning this test can be used) for the duration of the COVID-19 declaration under Section 564(b)(1) of the Act, 21 U.S.C. section 360bbb-3(b)(1), unless the authorization is terminated or revoked.  Performed at [facility], [address] [place], [place] [address]   CSF culture w Gram Stain     Status: None (Preliminary result)   Collection Time: 07/28/22  1:30 PM   Specimen: CSF; Cerebrospinal Fluid  Result Value Ref Range Status   Specimen Description CSF  Final   Special Requests  NONE  Final   Gram Stain   Final    NO ORGANISMS SEEN WBC SEEN RED BLOOD CELLS NONE SEEN Performed at [facility] [address] [place], [place] [address]    Culture PENDING  Incomplete   Report Status PENDING  Incomplete    IMAGING RESULTS: MRI reviewed- see above I have personally reviewed the films ? 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 ? ___________________________________________________ Discussed with patient,and care team ID will sign off - call if needed Note:  This document was prepared using Dragon voice recognition software and may include unintentional dictation errors.

## 2022-07-28 NOTE — Progress Notes (Addendum)
  Progress Note   Patient: Sara Pittman IOX:[id number] DOB: Oct 07, 1951 DOA: 07/23/2022     3 DOS: the patient was seen and examined on 07/28/2022   Brief hospital course: 71 year old female with a known history of stage IV UVL melanoma undergoing immunotherapy at [facility] with Dr. Rico Ala, oncology is admitted for lower extremity weakness  9/30: Still feeling weak, nausea 10/1: Hypokalemia.  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  CSF studies pending, ID consult   Assessment and Plan: * Severe major depression, single episode, without psychotic features ([place]) Seen by psychiatry.  Lexapro started on 10/2.  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  Further evaluation based on the CSF results.  Neuro following and ID consult requested  Hypothyroidism Elevated TSH at 24.62.  Increase dose of Synthroid from 75 to 100 mcg.  Hypercalcemia Now normalized status post IV hydration.   GERD (gastroesophageal reflux disease) Continue PPI  Nausea & vomiting Related to GERD/ suspect underlying esophagitis. Continue Protonix.  She is tolerating diet and not having any vomiting.  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  She also has numbness in the left upper extremity in her lower extremities  MRI of the brain and lumbar spine did not show any acute pathology  -Discussed with neurology -her findings are concerning for GBS and or polyradiculoneuropathy -S/p IR guided lumbar puncture.  CSF studies pending.  She does have elevated WBC in the CSF for which ID consult is  requested -I have requested meningitis/encephalitis panel and CSF as add-on per ID request -Her differential diagnosis still remains very broad -cannot rule out immunotherapy related GBS - Appreciate neuro input       DM II (diabetes mellitus, type II), controlled ([place]) Well-controlled for now        Subjective: Numbness of left upper extremity, weakness of both lower extremity along with numbness persist  Physical Exam: Vitals:   07/27/22 1957 07/27/22 2044 07/28/22 0514 07/28/22 0855  BP: (!) 148/85  131/76 (!) 143/88  Pulse: 72  61 63  Resp: '16 18 16 18  '$ Temp: 98.3 F (36.8 C)  97.9 F (36.6 C) 98.2 F (36.8 C)  TempSrc:    Oral  SpO2: 100%  94% 95%  Weight:      Height:       71 year old female lying in the bed comfortably without any acute distress Lungs clear to auscultation bilaterally Cardiovascular regular rate and rhythm Abdomen soft, benign Neuro alert and awake, left lower extremity weaker than right, sensory deficit both lower extremities and left upper extremity + Skin no rash or lesion Psych depressed mood Data Reviewed:  CSF -> protein 373, glucose 166, WBC 26 and lymph 98  Family Communication: Updated Sister Vernell over phone  Disposition: Status is: Inpatient Remains inpatient appropriate because: Further evaluation and work-up for polyradiculo neuropathy per ID and neurology   Planned Discharge Destination: Skilled nursing facility    DVT prophylaxis Lovenox Time spent: 30 minutes  Author: Max Sane, MD 07/28/2022 2:30 PM  For on call review [email or web address].

## 2022-07-28 NOTE — Assessment & Plan Note (Signed)
Related to GERD/ suspect underlying esophagitis. Continue Protonix.  She is tolerating diet and not having any vomiting.

## 2022-07-28 NOTE — Progress Notes (Signed)
Subjective: No improvement to her strength relative to yesterday.   Objective: Current vital signs: BP (!) 143/88 (BP Location: Right Arm)   Pulse 63   Temp 98.2 F (36.8 C) (Oral)   Resp 18   Ht '5\' 7"'$  (1.702 m)   Wt 66.2 kg   SpO2 95%   BMI 22.87 kg/m  Vital signs in last 24 hours: Temp:  [97.9 F (36.6 C)-98.3 F (36.8 C)] 98.2 F (36.8 C) (10/03 0855) Pulse Rate:  [61-72] 63 (10/03 0855) Resp:  [14-18] 18 (10/03 0855) BP: (131-159)/(76-88) 143/88 (10/03 0855) SpO2:  [94 %-100 %] 95 % (10/03 0855)  Intake/Output from previous day: 10/02 0701 - 10/03 0700 In: 360 [P.O.:360] Out: 600 [Urine:600] Intake/Output this shift: No intake/output data recorded. Nutritional status:  Diet Order             Diet regular Room service appropriate? Yes; Fluid consistency: Thin  Diet effective now                  Physical Exam  HEENT-  [place]/AT   Lungs- Respirations unlabored.  Extremities- No edema   Neurological Examination Mental Status: Awake and alert. Fully oriented. Thought content appropriate. Speech fluent without evidence of aphasia.  Able to follow all commands without difficulty. 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 No change since yesterday.  III,IV, VI: Mild right ptosis. No nystagmus. EOMI.  V: Unchanged VII: Smile mildly asymmetric while speaking but with purposeful grimace and cheek puff perioral muscles contract equally VIII: Hearing intact to voice IX,X: No hypophonia or hoarseness XI: Symmetric XII: No lingual dysarthria Motor: RUE 4/5 deltoid, triceps, biceps and grip LUE 4-/5 deltoid, triceps, biceps and grip; 2/5 wrist flexion and extension RLE 2/5 hip flexion, hip abduction and hip internal rotation; 3/5 knee extension, 2/5 knee flexion, 4-/5 APF/ADF LLE 1-2/5 hip flexion, hip abduction and hip internal rotation; 1-2/5 knee extension, 1-2/5 knee flexion, 4-/5 APF/ADF Sensory: Decreased temp sensation and dysesthesia to temp circumferentially  to BLE with sensory level at T10 on the left and L1 on the right. Also noted is severe proprioceptive deficits to her toes.  Deep Tendon Reflexes: Absent achilles and patellar reflexes bilaterally. Trace biceps, triceps and brachioradialis bilaterally. Toes downgoing.  Cerebellar: Moderate ataxia with FNF on the left is somewhat worsened since yesterday. No ataxia on the right. Unable to assess H-S due to weakness.  Gait: Unable to assess  Lab Results: Results for orders placed or performed during the hospital encounter of 07/23/22 (from the past 48 hour(s))  Basic metabolic panel     Status: Abnormal   Collection Time: 07/27/22  6:41 AM  Result Value Ref Range   Sodium 136 135 - 145 mmol/L   Potassium 3.5 3.5 - 5.1 mmol/L   Chloride 105 98 - 111 mmol/L   CO2 22 22 - 32 mmol/L   Glucose, Bld 171 (H) 70 - 99 mg/dL    Comment: Glucose reference range applies only to samples taken after fasting for at least 8 hours.   BUN 10 8 - 23 mg/dL   Creatinine, Ser 1.08 (H) 0.44 - 1.00 mg/dL   Calcium 9.2 8.9 - 10.3 mg/dL   GFR, Estimated 55 (L) >60 mL/min    Comment: (NOTE) Calculated using the CKD-EPI Creatinine Equation (2021)    Anion gap 9 5 - 15    Comment: Performed at [facility], [address], [place], [place] [address]  CBC     Status: None  Collection Time: 07/27/22  6:41 AM  Result Value Ref Range   WBC 5.5 4.0 - 10.5 K/uL   RBC 4.65 3.87 - 5.11 MIL/uL   Hemoglobin 13.1 12.0 - 15.0 g/dL   HCT 40.0 36.0 - 46.0 %   MCV 86.0 80.0 - 100.0 fL   MCH 28.2 26.0 - 34.0 pg   MCHC 32.8 30.0 - 36.0 g/dL   RDW 15.1 11.5 - 15.5 %   Platelets 281 150 - 400 K/uL   nRBC 0.0 0.0 - 0.2 %    Comment: Performed at [facility], [address] [place], [place] [address]  Vitamin B12     Status: Abnormal   Collection Time: 07/27/22 10:23 AM  Result Value Ref Range   Vitamin B-12 1,233 (H) 180 - 914 pg/mL    Comment: (NOTE) This assay is not validated for testing neonatal  or myeloproliferative syndrome specimens for Vitamin B12 levels. Performed at [facility], [place] [address] [place], [place] [address]   Basic metabolic panel     Status: Abnormal   Collection Time: 07/28/22  5:50 AM  Result Value Ref Range   Sodium 135 135 - 145 mmol/L   Potassium 4.0 3.5 - 5.1 mmol/L   Chloride 105 98 - 111 mmol/L   CO2 23 22 - 32 mmol/L   Glucose, Bld 241 (H) 70 - 99 mg/dL    Comment: Glucose reference range applies only to samples taken after fasting for at least 8 hours.   BUN 15 8 - 23 mg/dL   Creatinine, Ser 1.13 (H) 0.44 - 1.00 mg/dL   Calcium 9.1 8.9 - 10.3 mg/dL   GFR, Estimated 52 (L) >60 mL/min    Comment: (NOTE) Calculated using the CKD-EPI Creatinine Equation (2021)    Anion gap 7 5 - 15    Comment: Performed at [facility], [address] [place], [place] [address]  CBC     Status: None   Collection Time: 07/28/22  5:50 AM  Result Value Ref Range   WBC 5.7 4.0 - 10.5 K/uL   RBC 4.71 3.87 - 5.11 MIL/uL   Hemoglobin 13.1 12.0 - 15.0 g/dL   HCT 40.1 36.0 - 46.0 %   MCV 85.1 80.0 - 100.0 fL   MCH 27.8 26.0 - 34.0 pg   MCHC 32.7 30.0 - 36.0 g/dL   RDW 14.9 11.5 - 15.5 %   Platelets 326 150 - 400 K/uL   nRBC 0.0 0.0 - 0.2 %    Comment: Performed at [facility], [address] [place], [place] [address]  CSF cell count with differential     Status: Abnormal   Collection Time: 07/28/22 10:33 AM  Result Value Ref Range   Tube # 3    Color, CSF STRAW (A) COLORLESS   Appearance, CSF CLEAR CLEAR   Supernatant NOT INDICATED    RBC Count, CSF 55 (H) 0 /cu mm   WBC, CSF 26 (HH) 0 - 5 /cu mm    Comment: CRITICAL RESULT CALLED TO, READ BACK BY AND VERIFIED WITH: VERA WILLIAMS RN 1200 07/28/22 HNM    Segmented Neutrophils-CSF 0 0 - 6 %   Lymphs, CSF 98 (H) 40 - 80 %   Monocyte-Macrophage-Spinal Fluid 2 (L) 15 - 45 %   Eosinophils, CSF 0 0 - 1 %    Comment: Performed at [facility], [address],  [place], [place] [address]    Recent Results (from the past  240 hour(s))  Resp Panel by RT-PCR (Flu A&B, Covid) Anterior Nasal Swab     Status: None   Collection Time: 07/23/22 12:06 PM   Specimen: Anterior Nasal Swab  Result Value Ref Range Status   SARS Coronavirus 2 by RT PCR NEGATIVE NEGATIVE Final    Comment: (NOTE) SARS-CoV-2 target nucleic acids are NOT DETECTED.  The SARS-CoV-2 RNA is generally detectable in upper respiratory specimens during the acute phase of infection. The lowest concentration of SARS-CoV-2 viral copies this assay can detect is 138 copies/mL. A negative result does not preclude SARS-Cov-2 infection and should not be used as the sole basis for treatment or other patient management decisions. A negative result may occur with  improper specimen collection/handling, submission of specimen other than nasopharyngeal swab, presence of viral mutation(s) within the areas targeted by this assay, and inadequate number of viral copies(<138 copies/mL). A negative result must be combined with clinical observations, patient history, and epidemiological information. The expected result is Negative.  Fact Sheet for Patients:  EntrepreneurPulse.com.au  Fact Sheet for Healthcare Providers:  IncredibleEmployment.be  This test is no t yet approved or cleared by the [place] FDA and  has been authorized for detection and/or diagnosis of SARS-CoV-2 by FDA under an Emergency Use Authorization (EUA). This EUA will remain  in effect (meaning this test can be used) for the duration of the COVID-19 declaration under Section 564(b)(1) of the Act, 21 U.S.C.section 360bbb-3(b)(1), unless the authorization is terminated  or revoked sooner.       Influenza A by PCR NEGATIVE NEGATIVE Final   Influenza B by PCR NEGATIVE NEGATIVE Final    Comment: (NOTE) The Xpert Xpress SARS-CoV-2/FLU/RSV plus assay is intended as an aid in the diagnosis of  influenza from Nasopharyngeal swab specimens and should not be used as a sole basis for treatment. Nasal washings and aspirates are unacceptable for Xpert Xpress SARS-CoV-2/FLU/RSV testing.  Fact Sheet for Patients: EntrepreneurPulse.com.au  Fact Sheet for Healthcare Providers: IncredibleEmployment.be  This test is not yet approved or cleared by the [place] FDA and has been authorized for detection and/or diagnosis of SARS-CoV-2 by FDA under an Emergency Use Authorization (EUA). This EUA will remain in effect (meaning this test can be used) for the duration of the COVID-19 declaration under Section 564(b)(1) of the Act, 21 U.S.C. section 360bbb-3(b)(1), unless the authorization is terminated or revoked.  Performed at [facility], [address] [place], [place] [address]     Lipid Panel No results for input(s): "CHOL", "TRIG", "HDL", "CHOLHDL", "VLDL", "LDLCALC" in the last 72 hours.  Studies/Results: MR LUMBAR SPINE W CONTRAST  Result Date: 07/27/2022 CLINICAL DATA:  History of uveal melanoma undergoing immunotherapy. Lower extremity weakness EXAM: MRI CERVICAL, THORACIC WITHOUT AND WITH CONTRAST MRI LUMBAR SPINE WITH CONTRAST TECHNIQUE: Multiplanar and multiecho pulse sequences of the cervical spine, to include the craniocervical junction and cervicothoracic junction, and thoracic and lumbar spine, were obtained with intravenous contrast. Precontrast sequences were obtained through the cervical and thoracic. CONTRAST:  43m GADAVIST GADOBUTROL 1 MMOL/ML IV SOLN COMPARISON:  None Available. FINDINGS: MRI CERVICAL SPINE FINDINGS Alignment: Normal Vertebrae: No fracture, evidence of discitis, or bone lesion. Cord: Lesion with mild swelling in the left posterior cord which is enhancing and measures 4 mm. No intrinsic T1 shortening of this lesion. Prominent enhancement of intrathecal nerve roots diffusely. Posterior Fossa, vertebral  arteries, paraspinal tissues: Negative Disc levels: Ordinary disc desiccation and narrowing that is diffuse. No neural impingement. Root sleeve cyst at  the left C7-T1 foramen. MRI THORACIC SPINE FINDINGS Alignment:  Normal Vertebrae: No fracture, evidence of discitis, or bone lesion. Cord: Diffuse prominence of intrathecal nerve root enhancement. Small area of superficial enhancement involving the right cord at the level of T3-4. Paraspinal and other soft tissues: Nodule in the left lung measuring 17 mm peer Disc levels: Ordinary spondylitic and facet spurring the lower thoracic spine. MRI LUMBAR SPINE FINDINGS Segmentation:  5 lumbar type vertebrae Alignment:  Normal Vertebrae:  No fracture, evidence of discitis, or bone lesion. Conus medullaris and cauda equina: Conus extends to the L1 level. Diffuse coating of the lower cord and cauda equina Paraspinal and other soft tissues: Negative Disc levels: L5-S1 disc degeneration IMPRESSION: 1. 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 A parenchymal based nodule is also seen in the left posterior cord at C2. 2. Suspicious nodule in the left lung, please correlate with any outside chest CT. 3. Consider postcontrast imaging of the brain given the pattern of disease. 4. Postcontrast only lumbar imaging. Electronically Signed   By: Jorje Guild M.D.   On: 07/27/2022 18:59   MR CERVICAL SPINE W WO CONTRAST  Result Date: 07/27/2022 CLINICAL DATA:  History of uveal melanoma undergoing immunotherapy. Lower extremity weakness EXAM: MRI CERVICAL, THORACIC WITHOUT AND WITH CONTRAST MRI LUMBAR SPINE WITH CONTRAST TECHNIQUE: Multiplanar and multiecho pulse sequences of the cervical spine, to include the craniocervical junction and cervicothoracic junction, and thoracic and lumbar spine, were obtained with intravenous contrast. Precontrast sequences were obtained through the cervical and  thoracic. CONTRAST:  86m GADAVIST GADOBUTROL 1 MMOL/ML IV SOLN COMPARISON:  None Available. FINDINGS: MRI CERVICAL SPINE FINDINGS Alignment: Normal Vertebrae: No fracture, evidence of discitis, or bone lesion. Cord: Lesion with mild swelling in the left posterior cord which is enhancing and measures 4 mm. No intrinsic T1 shortening of this lesion. Prominent enhancement of intrathecal nerve roots diffusely. Posterior Fossa, vertebral arteries, paraspinal tissues: Negative Disc levels: Ordinary disc desiccation and narrowing that is diffuse. No neural impingement. Root sleeve cyst at the left C7-T1 foramen. MRI THORACIC SPINE FINDINGS Alignment:  Normal Vertebrae: No fracture, evidence of discitis, or bone lesion. Cord: Diffuse prominence of intrathecal nerve root enhancement. Small area of superficial enhancement involving the right cord at the level of T3-4. Paraspinal and other soft tissues: Nodule in the left lung measuring 17 mm peer Disc levels: Ordinary spondylitic and facet spurring the lower thoracic spine. MRI LUMBAR SPINE FINDINGS Segmentation:  5 lumbar type vertebrae Alignment:  Normal Vertebrae:  No fracture, evidence of discitis, or bone lesion. Conus medullaris and cauda equina: Conus extends to the L1 level. Diffuse coating of the lower cord and cauda equina Paraspinal and other soft tissues: Negative Disc levels: L5-S1 disc degeneration IMPRESSION: 1. 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 A parenchymal based nodule is also seen in the left posterior cord at C2. 2. Suspicious nodule in the left lung, please correlate with any outside chest CT. 3. Consider postcontrast imaging of the brain given the pattern of disease. 4. Postcontrast only lumbar imaging. Electronically Signed   By: JJorje GuildM.D.   On: 07/27/2022 18:59   MR THORACIC SPINE W WO CONTRAST  Result Date: 07/27/2022 CLINICAL DATA:  History of  uveal melanoma undergoing immunotherapy. Lower extremity weakness EXAM: MRI CERVICAL, THORACIC WITHOUT AND WITH CONTRAST MRI LUMBAR SPINE WITH CONTRAST TECHNIQUE: Multiplanar and multiecho pulse sequences of the cervical  spine, to include the craniocervical junction and cervicothoracic junction, and thoracic and lumbar spine, were obtained with intravenous contrast. Precontrast sequences were obtained through the cervical and thoracic. CONTRAST:  40m GADAVIST GADOBUTROL 1 MMOL/ML IV SOLN COMPARISON:  None Available. FINDINGS: MRI CERVICAL SPINE FINDINGS Alignment: Normal Vertebrae: No fracture, evidence of discitis, or bone lesion. Cord: Lesion with mild swelling in the left posterior cord which is enhancing and measures 4 mm. No intrinsic T1 shortening of this lesion. Prominent enhancement of intrathecal nerve roots diffusely. Posterior Fossa, vertebral arteries, paraspinal tissues: Negative Disc levels: Ordinary disc desiccation and narrowing that is diffuse. No neural impingement. Root sleeve cyst at the left C7-T1 foramen. MRI THORACIC SPINE FINDINGS Alignment:  Normal Vertebrae: No fracture, evidence of discitis, or bone lesion. Cord: Diffuse prominence of intrathecal nerve root enhancement. Small area of superficial enhancement involving the right cord at the level of T3-4. Paraspinal and other soft tissues: Nodule in the left lung measuring 17 mm peer Disc levels: Ordinary spondylitic and facet spurring the lower thoracic spine. MRI LUMBAR SPINE FINDINGS Segmentation:  5 lumbar type vertebrae Alignment:  Normal Vertebrae:  No fracture, evidence of discitis, or bone lesion. Conus medullaris and cauda equina: Conus extends to the L1 level. Diffuse coating of the lower cord and cauda equina Paraspinal and other soft tissues: Negative Disc levels: L5-S1 disc degeneration IMPRESSION: 1. 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 A  parenchymal based nodule is also seen in the left posterior cord at C2. 2. Suspicious nodule in the left lung, please correlate with any outside chest CT. 3. Consider postcontrast imaging of the brain given the pattern of disease. 4. Postcontrast only lumbar imaging. Electronically Signed   By: JJorje GuildM.D.   On: 07/27/2022 18:59   MR BRAIN WO CONTRAST  Result Date: 07/27/2022 CLINICAL DATA:  Neuro deficit, acute, stroke suspected. History of melanoma with immunotherapy. EXAM: MRI HEAD WITHOUT CONTRAST TECHNIQUE: Multiplanar, multiecho pulse sequences of the brain and surrounding structures were obtained without intravenous contrast. COMPARISON:  Head CT 07/23/2022 FINDINGS: Brain: Diffusion imaging does not show any acute or subacute infarction. No focal abnormality is seen affecting the brainstem or cerebellum. Cerebral hemispheres show mild age related volume loss with a few minor changes of chronic small vessel disease of the white matter. No cortical or large vessel territory infarction. No evidence of mass or vasogenic edema. No hemorrhage, hydrocephalus or extra-axial collection. Single punctate focus of hemosiderin deposition noted in the left frontal white matter, not significant as an isolated finding. Vascular: Major vessels at the base of the brain show flow. Skull and upper cervical spine: Negative Sinuses/Orbits: Clear/normal Other: None IMPRESSION: No acute or reversible finding. Mild age related volume loss. Minimal chronic small-vessel change of the hemispheric white matter. Single punctate focus of hemosiderin deposition in the left frontal white matter, not significant as an isolated finding. No sign of metastatic melanoma. Electronically Signed   By: MNelson ChimesM.D.   On: 07/27/2022 13:26    Medications: Scheduled:  amLODipine  5 mg Oral Daily   cholecalciferol  1,000 Units Oral Daily   escitalopram  5 mg Oral Daily   feeding supplement  1 Container Oral TID BM    levothyroxine  100 mcg Oral Q0600   lidocaine (PF)  5 mL Intradermal Once   losartan  100 mg Oral Daily   metoprolol succinate  50 mg Oral Daily   multivitamin with minerals  1  tablet Oral Daily   pantoprazole  40 mg Oral BID AC   potassium chloride  20 mEq Oral BID   topiramate  25 mg Oral Daily    Assessment: 71 year old female with metastatic melanoma, presenting with progressive BLE weakness. Exam reveals quadriparesis with legs significantly weaker than arms, in conjunction with areflexic lower extremities and hyporeflexic upper extremities.   - Exam reveals motor findings best localizable to the lumbar nerve roots/cauda equina, most likely due to a motor neuropathy, including severe weakness and areflexia/hyporeflexia. Exam also reveals sensory findings best localizable to the lumbar nerve roots/cauda equina/lower extremity peripheral nerves in both a small and large fiber distribution. 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  - Imaging studies: - MRI L-spine: No spinal canal stenosis or neural foraminal narrowing. Multilevel facet arthropathy, which can be a cause of back pain. Follow up postcontrast imaging reveals diffuse coating of the lower cord and cauda equina  - MRI brain: No acute or reversible finding. Mild age related volume loss. Minimal chronic small-vessel change of the hemispheric white matter. Single punctate focus of hemosiderin deposition in the left frontal white matter, not significant as an isolated finding. No sign of metastatic melanoma.  - MRI cervical spine w/wo contrast: Lesion with mild swelling in the left posterior cord which is enhancing and measures 4 mm. No intrinsic T1 shortening of this lesion. Prominent enhancement of intrathecal nerve roots diffusely. - MRI thoracic spine w/wo contrast:  Diffuse prominence of intrathecal nerve root enhancement. Small area of  superficial enhancement involving the right cord at the level of T3-4. - MRI scan also reveals a lung nodule - Vitamin E alpha and gamma: Normal - Phosphorus normal.  - Mg low on admission at 1.6. Now corrected without improvement of her weakness.  - Vitamin B12 was low on admission at 291, then artificially elevated due to supplementation.  - CSF labs resulted so far:  - Elevated WBC of 26 predominantly lymphocytic (98%) and with 0 segmented neutrophils, mildly elevated RBC consistent with traumatic tap, straw colored, clear  - CSF protein level markedly elevated at 373. 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 Not consistent with infection.  - Cytology pending - IgG index pending - Overall her clinical picture is not consistent with an infectious etiology despite the elevated CSF WBC, but will need ID for second opinion - DDx: Overall symptoms and neuro exam findings were initially thought to be most consistent with GBS. However, imaging results are most consistent with metastatic melanoma seeding to spinal nerve roots with focal involvement of the cord as well.     Recommendations: - Restart ASA and Lovenox following LP - Vitamin B12 supplementation inpatient as well as outpatient over the long term - Oncology consult  - Added VDRL, gram stain, fungal/bacterial culture and lyme titer to CSF labs today, but these are likely to be low yield   LOS: 3 days   '@Electronically'$  signed: Dr. Kerney Elbe 07/28/2022  12:56 PM

## 2022-07-28 NOTE — Assessment & Plan Note (Signed)
Now normalized status post IV hydration.

## 2022-07-28 NOTE — Progress Notes (Addendum)
OT Cancellation Note  Patient Details Name: Sara Pittman MRN: [id number] DOB: 1951-08-01   Cancelled Treatment:    Reason Eval/Treat Not Completed: Other (comment). Pt s/p LP. Secure chat sent to MDs to clarify mobility restrictions after LP. Will hold OT tx until any restrictions are clarified. Addendum: per MD, pt must lay flat 4 hrs after LP. Will re-attempt once restrictions have been lifted.   Ardeth Perfect., MPH, MS, OTR/L ascom [phone] 07/28/22, 1:20 PM

## 2022-07-28 NOTE — Assessment & Plan Note (Signed)
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  Further evaluation based on the CSF results.  Neuro following and ID consult requested

## 2022-07-29 DIAGNOSIS — C6941 Malignant neoplasm of right ciliary body: Secondary | ICD-10-CM | POA: Diagnosis not present

## 2022-07-29 DIAGNOSIS — C6940 Malignant neoplasm of unspecified ciliary body: Secondary | ICD-10-CM

## 2022-07-29 DIAGNOSIS — G610 Guillain-Barre syndrome: Secondary | ICD-10-CM | POA: Diagnosis not present

## 2022-07-29 LAB — SEDIMENTATION RATE: Sed Rate: 5 mm/hr (ref 0–30)

## 2022-07-29 LAB — C-REACTIVE PROTEIN: CRP: 0.6 mg/dL (ref <1.0)

## 2022-07-29 LAB — CBC
HCT: 40.6 % (ref 36.0–46.0)
Hemoglobin: 13.1 g/dL (ref 12.0–15.0)
MCH: 27.3 pg (ref 26.0–34.0)
MCHC: 32.3 g/dL (ref 30.0–36.0)
MCV: 84.8 fL (ref 80.0–100.0)
Platelets: 358 10*3/uL (ref 150–400)
RBC: 4.79 MIL/uL (ref 3.87–5.11)
RDW: 14.6 % (ref 11.5–15.5)
WBC: 5.9 10*3/uL (ref 4.0–10.5)
nRBC: 0 % (ref 0.0–0.2)

## 2022-07-29 LAB — BASIC METABOLIC PANEL
Anion gap: 5 (ref 5–15)
BUN: 18 mg/dL (ref 8–23)
CO2: 22 mmol/L (ref 22–32)
Calcium: 9.3 mg/dL (ref 8.9–10.3)
Chloride: 107 mmol/L (ref 98–111)
Creatinine, Ser: 0.96 mg/dL (ref 0.44–1.00)
GFR, Estimated: >60 mL/min (ref >60)
Glucose, Bld: 208 mg/dL — ABNORMAL HIGH (ref 70–99)
Potassium: 4.6 mmol/L (ref 3.5–5.1)
Sodium: 134 mmol/L — ABNORMAL LOW (ref 135–145)

## 2022-07-29 LAB — CK: Total CK: 48 U/L (ref 38–234)

## 2022-07-29 LAB — LYME DISEASE SEROLOGY W/REFLEX: Lyme Total Antibody EIA: NEGATIVE

## 2022-07-29 MED ORDER — GLIPIZIDE 5 MG PO TABS
5.0000 mg | ORAL_TABLET | Freq: Two times a day (BID) | ORAL | Status: DC
  Administered 2022-07-29 – 2022-07-30 (×3): 5 mg via ORAL
  Filled 2022-07-29 (×3): qty 1

## 2022-07-29 MED ORDER — VITAMIN C 500 MG PO TABS
500.0000 mg | ORAL_TABLET | Freq: Two times a day (BID) | ORAL | Status: DC
  Administered 2022-07-29 – 2022-07-30 (×2): 500 mg via ORAL
  Filled 2022-07-29 (×2): qty 1

## 2022-07-29 MED ORDER — METHYLPREDNISOLONE SODIUM SUCC 125 MG IJ SOLR
67.5000 mg | Freq: Two times a day (BID) | INTRAMUSCULAR | Status: DC
  Administered 2022-07-29 – 2022-07-30 (×3): 67.5 mg via INTRAVENOUS
  Filled 2022-07-29 (×3): qty 2

## 2022-07-29 NOTE — Progress Notes (Signed)
Progress Note   Patient: Sara Pittman:[id number] DOB: 28-Aug-1951 DOA: 07/23/2022     4 DOS: the patient was seen and examined on 07/29/2022   Brief hospital course: 71 year old female with a known history of stage IV UVL melanoma undergoing immunotherapy at [place] with Dr. Rico Ala, oncology is admitted for lower extremity weakness  9/30: Still feeling weak, nausea 10/1: Hypokalemia.  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  CSF studies pending, ID consult.  10/4: Patient with worsening lower extremity weakness and no sensation.  Now started having left upper extremity tingling, numbness and weakness.  Preliminary LP labs with no infection or malignant cells.  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 ID was able to contact her oncologist Dr. Rico Ala at [facility], who are recommending transfer to [place], obtaining CRP, ESR and Aldolase, starting her on high-dose steroid. Neurology and ID both are on board. Transfer process initiated. Patient is being started on Solu-Medrol 1 mg/kg twice daily.    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  CSF studies negative for any acute infection and preliminary results with negative malignant cells.  Acute meningitis panel  negative. Concern of either disease progression with leptomeningeal carcinomatosis or side effect of immunotherapy. Oncology at [facility] was also consulted and they are recommending starting her on Solu-Medrol and try to transfer her to [facility]. -Ordered Solu-Medrol 1 mg/kg twice daily. 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   -See above  Uveal melanoma, anterior (HCC) Patient with history of stage IV uveal melanoma s/p immunotherapy at [place] with Dr. Rico Ala. Might be experiencing disease progression with leptomeningeal carcinomatosis versus side effect of immunotherapy causing GBS. Discussed with her on oncologist and who will prefer her to be transferred to [facility]. -Transfer process initiated  Severe major depression, single episode, without psychotic features ([place]) Seen by psychiatry.  Lexapro started on 10/2.  No further suicidal ideation  DM II (diabetes mellitus, type II), controlled ([place]) Well-controlled for now  GERD (gastroesophageal reflux disease) Continue PPI  Nausea & vomiting Related to GERD/ suspect underlying esophagitis. Continue Protonix.  She is tolerating diet and not having any vomiting.  Hypercalcemia Now normalized status post IV hydration.   Hypothyroidism Elevated TSH at 24.62.  Increase dose of Synthroid from 75 to 100 mcg. -Will need repeat test in 4 to 6 weeks      Subjective: Patient was seen and examined today.  She was complaining of worsening left upper extremity weakness along with tingling and numbness.  She has no sensations or movement in lower extremities bilaterally.  Physical Exam: Vitals:   07/28/22 0855 07/28/22 1551 07/28/22 2017 07/29/22 0752  BP: (!) 143/88 (!) 180/91 (!) 152/88 (!) 152/84  Pulse: 63 (!) 59 61 62  Resp: 18 16 16 18   Temp: 98.2 F (36.8 C) 98.4 F (36.9 C) 98.1 F (36.7  C) 98 F (36.7 C)  TempSrc: Oral Oral Oral Oral  SpO2: 95% 98% 97% 95%  Weight:      Height:        General.  Ill-appearing lady, sitting in chair, in no acute distress. Pulmonary.  Lungs clear bilaterally, normal respiratory effort. CV.  Regular rate and rhythm, no JVD, rub or murmur. Abdomen.  Soft, nontender, nondistended, BS positive. CNS.  Alert and oriented .  0/5 strength in bilateral lower extremities, 2-3/5 on left upper extremity and 4/5 in right upper extremity.  Appears to have some left-sided facial droop.  Altered sensation affecting all extremities. Extremities.  No edema, no cyanosis, pulses intact and symmetrical. Psychiatry.  Judgment and insight appears normal.  Data Reviewed: Prior data reviewed  Family Communication: Discussed with sister on phone.  She will communicate with her kids who live out of town.  Disposition: Status is: Inpatient Remains inpatient appropriate because: Severity of illness   Planned Discharge Destination: Most likely transfer to [facility].  DVT prophylaxis.  Lovenox Time spent: 55 minutes  This record has been created using Systems analyst. Errors have been sought and corrected,but may not always be located. Such creation errors do not reflect on the standard of care.  Author: Lorella Nimrod, MD 07/29/2022 2:26 PM  For on call review [email or web address].

## 2022-07-29 NOTE — Progress Notes (Signed)
Occupational Therapy Treatment Patient Details Name: Sara Pittman MRN: [id number] DOB: 12-03-1950 Today's Date: 07/29/2022   History of present illness Pt is a 71 y/o F admitted on 07/23/22 after presenting with c/o BLE weakness for the past few days, falling twice, as well as nausea. Pt's BLE weakness is suspected 2/2 electrolyte abnormality. PMH: DM, GERD, heart murmur, HTN. 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 Patient found to have metastatic lung Ca and nodule on C2.   OT comments  Chart reviewed, Co tx completed with PT on this date. Pt continues to present with worsening weakness throughout BLE, ?foot drop noted. MAX A +2 required for lateral scoot to the bed with pt able to assist with RUE. MOD A required for UB dressing, SET Up required for oral care with pt spilling over cup, requiring MIN A for washing face Discharge recommendation remains appropriate, OT will continue to follow acutely.    Recommendations for follow up therapy are one component of a multi-disciplinary discharge planning process, led by the attending physician.  Recommendations may be updated based on patient status, additional functional criteria and insurance authorization.    Follow Up Recommendations  Skilled nursing-short term rehab (<3 hours/day)    Assistance Recommended at Discharge Frequent or constant Supervision/Assistance  Patient can return home with the following  A lot of help with walking and/or transfers;A lot of help with bathing/dressing/bathroom   Equipment Recommendations  BSC/3in1;Tub/shower seat    Recommendations for Other Services      Precautions / Restrictions Precautions Precautions: Fall Restrictions Weight Bearing Restrictions: No       Mobility Bed Mobility Overal bed mobility: Needs Assistance Bed Mobility: Supine to Sit     Supine to sit: Max assist, +2 for physical assistance          Transfers Overall  transfer level: Needs assistance Equipment used: None Transfers: Bed to chair/wheelchair/BSC            Lateral/Scoot Transfers: Max assist, +2 physical assistance       Balance Overall balance assessment: Needs assistance, History of Falls Sitting-balance support: Feet supported Sitting balance-Leahy Scale: Poor Sitting balance - Comments: CGA at all times     Standing balance-Leahy Scale: Zero                             ADL either performed or assessed with clinical judgement   ADL Overall ADL's : Needs assistance/impaired     Grooming: Sitting;Set up;Oral care       Lower Body Bathing: Maximal assistance   Upper Body Dressing : Moderate assistance;Sitting Upper Body Dressing Details (indicate cue type and reason): using hemi technique     Toilet Transfer: Maximal assistance;+2 for physical assistance;+2 for safety/equipment Toilet Transfer Details (indicate cue type and reason): lateral scoot to bedside chair Toileting- Clothing Manipulation and Hygiene: Maximal assistance              Extremity/Trunk Assessment              Vision       Perception     Praxis      Cognition Arousal/Alertness: Awake/alert Behavior During Therapy: WFL for tasks assessed/performed, Flat affect Overall Cognitive Status: Within Functional Limits for tasks assessed  Exercises      Shoulder Instructions       General Comments      Pertinent Vitals/ Pain       Pain Assessment Pain Assessment: Faces Faces Pain Scale: Hurts even more Pain Location: BLE Pain Descriptors / Indicators: Discomfort Pain Intervention(s): Limited activity within patient's tolerance, Repositioned, Monitored during session  Home Living                                          Prior Functioning/Environment              Frequency  Min 2X/week        Progress Toward Goals  OT  Goals(current goals can now be found in the care plan section)  Progress towards OT goals: OT to reassess next treatment     Plan Discharge plan remains appropriate;Frequency remains appropriate    Co-evaluation      Reason for Co-Treatment: Necessary to address cognition/behavior during functional activity;For patient/therapist safety;To address functional/ADL transfers PT goals addressed during session: Mobility/safety with mobility        AM-PAC OT "6 Clicks" Daily Activity     Outcome Measure   Help from another person eating meals?: A Little Help from another person taking care of personal grooming?: A Lot Help from another person toileting, which includes using toliet, bedpan, or urinal?: A Lot Help from another person bathing (including washing, rinsing, drying)?: A Lot Help from another person to put on and taking off regular upper body clothing?: A Lot Help from another person to put on and taking off regular lower body clothing?: A Lot 6 Click Score: 13    End of Session    OT Visit Diagnosis: Repeated falls (R29.6);Muscle weakness (generalized) (M62.81)   Activity Tolerance Patient tolerated treatment well   Patient Left in chair;with call bell/phone within reach;with chair alarm set   Nurse Communication Mobility status        Time: 9833-8250 OT Time Calculation (min): 23 min  Charges: OT General Charges $OT Visit: 1 Visit OT Treatments $Self Care/Home Management : 8-22 mins  Shanon Payor, OTD OTR/L  07/29/22, 12:36 PM

## 2022-07-29 NOTE — Assessment & Plan Note (Signed)
Patient with history of stage IV uveal melanoma s/p immunotherapy at [facility] with Dr. Rico Ala. Might be experiencing disease progression with leptomeningeal carcinomatosis versus side effect of immunotherapy causing GBS. Discussed with her on oncologist and who will prefer her to be transferred to [facility]. -Transfer process initiated

## 2022-07-29 NOTE — Progress Notes (Signed)
Physical Therapy Treatment Patient Details Name: Sara Pittman MRN: [id number] DOB: 1951-07-21 Today's Date: 07/29/2022   History of Present Illness Pt is a 71 y/o F admitted on 07/23/22 after presenting with c/o BLE weakness for the past few days, falling twice, as well as nausea. Pt's BLE weakness is suspected 2/2 electrolyte abnormality. PMH: DM, GERD, heart murmur, HTN. 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 Patient found to have metastatic lung Ca and nodule on C2.    PT Comments    Patient received in bed, sister at bedside. She is agreeable to PT/OT session. "Do what you need to do." She has trace mobility/strength and poor sensation in B LEs. Poor L UE coordination, strength and sensation. Patient requires max +2 for supine to sit and heavy max+2 for lateral scoot transfer to recliner. Patient will continue to benefit from skilled PT to improve functional independence and strength.     Recommendations for follow up therapy are one component of a multi-disciplinary discharge planning process, led by the attending physician.  Recommendations may be updated based on patient status, additional functional criteria and insurance authorization.  Follow Up Recommendations  Skilled nursing-short term rehab (<3 hours/day) Can patient physically be transported by private vehicle: No   Assistance Recommended at Discharge Frequent or constant Supervision/Assistance  Patient can return home with the following Two people to help with walking and/or transfers;Two people to help with bathing/dressing/bathroom;Assist for transportation   Equipment Recommendations  None recommended by PT    Recommendations for Other Services       Precautions / Restrictions Precautions Precautions: Fall Restrictions Weight Bearing Restrictions: No     Mobility  Bed Mobility Overal bed mobility: Needs Assistance Bed Mobility: Supine to Sit     Supine  to sit: Max assist, +2 for physical assistance     General bed mobility comments: Patient requires constant hands on assist for sitting balance. Unstable generally in all directions.    Transfers Overall transfer level: Needs assistance Equipment used: None Transfers: Bed to chair/wheelchair/BSC            Lateral/Scoot Transfers: Max assist, +2 physical assistance General transfer comment: patient with VERY limited use of B LEs to assist with transfer and really no ability to use L UE to assist with transfers. Can assist with R UE. needed +2 max assist to safely get into recliner    Ambulation/Gait               General Gait Details: unable due to LE weakness   Stairs             Wheelchair Mobility    Modified Rankin (Stroke Patients Only)       Balance Overall balance assessment: Needs assistance, History of Falls Sitting-balance support: Feet supported Sitting balance-Leahy Scale: Poor Sitting balance - Comments: needs hands on at all times     Standing balance-Leahy Scale: Zero                              Cognition Arousal/Alertness: Awake/alert Behavior During Therapy: WFL for tasks assessed/performed Overall Cognitive Status: Within Functional Limits for tasks assessed                                 General Comments: appeared with depressed affect        Exercises  General Comments        Pertinent Vitals/Pain Pain Assessment Pain Assessment: Faces Faces Pain Scale: Hurts even more Pain Location: B LE pain with mobility Pain Descriptors / Indicators: Discomfort Pain Intervention(s): Monitored during session, Repositioned    Home Living                          Prior Function            PT Goals (current goals can now be found in the care plan section) Acute Rehab PT Goals Patient Stated Goal: get better, get the feeling back in her legs PT Goal Formulation: With patient Time For  Goal Achievement: 08/07/22 Potential to Achieve Goals: Fair Progress towards PT goals: Not progressing toward goals - comment    Frequency    Min 2X/week      PT Plan Current plan remains appropriate    Co-evaluation PT/OT/SLP Co-Evaluation/Treatment: Yes Reason for Co-Treatment: For patient/therapist safety;To address functional/ADL transfers PT goals addressed during session: Mobility/safety with mobility        AM-PAC PT "6 Clicks" Mobility   Outcome Measure  Help needed turning from your back to your side while in a flat bed without using bedrails?: A Lot Help needed moving from lying on your back to sitting on the side of a flat bed without using bedrails?: A Lot Help needed moving to and from a bed to a chair (including a wheelchair)?: Total Help needed standing up from a chair using your arms (e.g., wheelchair or bedside chair)?: Total Help needed to walk in hospital room?: Total Help needed climbing 3-5 steps with a railing? : Total 6 Click Score: 8    End of Session Equipment Utilized During Treatment: Gait belt Activity Tolerance: Patient limited by pain;Patient limited by fatigue Patient left: in chair;with call bell/phone within reach Nurse Communication: Mobility status;Other (comment) (discharge needs) PT Visit Diagnosis: Muscle weakness (generalized) (M62.81);Other abnormalities of gait and mobility (R26.89);History of falling (Z91.81);Pain;Other symptoms and signs involving the nervous system (R29.898) Pain - Right/Left:  (bilateral)     Time: 6468-0321 PT Time Calculation (min) (ACUTE ONLY): 21 min  Charges:  $Therapeutic Activity: 8-22 mins                     Damyen Knoll, PT, GCS 07/29/22,11:15 AM

## 2022-07-29 NOTE — Assessment & Plan Note (Signed)
Seen by psychiatry.  Lexapro started on 10/2.  No further suicidal ideation

## 2022-07-29 NOTE — Progress Notes (Signed)
PHARMACY CONSULT NOTE - FOLLOW UP  Pharmacy Consult for Electrolyte Monitoring and Replacement   Recent Labs: Potassium (mmol/L)  Date Value  07/29/2022 4.6   Magnesium (mg/dL)  Date Value  07/26/2022 2.3   Calcium (mg/dL)  Date Value  07/29/2022 9.3   Albumin (g/dL)  Date Value  07/23/2022 4.7   Phosphorus (mg/dL)  Date Value  07/24/2022 4.1   Sodium (mmol/L)  Date Value  07/29/2022 134 (L)    Assessment: Patient with PMH relevant for melanoma undergoing immunotherapy, hypothyroidism, DM, and GERD. Pharmacy consulted to manage electrolytes.  Goal of Therapy:  Electrolytes WNL  Plan:  K increased from 4.0 to 4.6. Discontinue oral Kcl '20mg'$  BID. No additional supplementation today. Will follow up AM labs and replace as needed.   [place] Pharmacist 07/29/2022 8:55 AM

## 2022-07-29 NOTE — Progress Notes (Signed)
Subjective: No improvement since yesterday.   Objective: Current vital signs: BP 126/74 (BP Location: Left Arm)   Pulse (!) 57   Temp 98 F (36.7 C) (Oral)   Resp 18   Ht _0  (1.702 m)   Wt 66.2 kg   SpO2 99%   BMI 22.87 kg/m  Vital signs in last 24 hours: Temp:  [98 F (36.7 C)-98.1 F (36.7 C)] 98 F (36.7 C) (10/04 1517) Pulse Rate:  [57-62] 57 (10/04 1517) Resp:  [16-18] 18 (10/04 1517) BP: (126-152)/(74-88) 126/74 (10/04 1517) SpO2:  [95 %-99 %] 99 % (10/04 1517)  Intake/Output from previous day: 10/03 0701 - 10/04 0700 In: 120 [P.O.:120] Out: 400 [Urine:400] Intake/Output this shift: Total I/O In: 180 [P.O.:180] Out: -  Nutritional status:  Diet Order             Diet regular Room service appropriate? Yes; Fluid consistency: Thin  Diet effective now                  Physical Exam  HEENT-  [place]/AT   Lungs- Respirations unlabored.  Extremities- No edema   Neurological Examination Mental Status: Awake and alert. Fully oriented. Thought content appropriate. Speech fluent without evidence of aphasia.  Able to follow all commands without difficulty. 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 Fixates and tracks normally with left eye.  III,IV, VI: Mild right ptosis. No nystagmus. EOMI.  V: Unchanged VII: Smile mildly asymmetric while speaking but with purposeful grimace and cheek puff perioral muscles contract equally VIII: Hearing intact to voice IX,X: No hypophonia or hoarseness XI: Symmetric XII: No lingual dysarthria Motor: RUE 4/5 deltoid, triceps, biceps and grip LUE 4-/5 deltoid, triceps, biceps and grip; 2/5 wrist flexion and extension RLE 2/5 hip flexion, hip abduction and hip internal rotation; 2/5 knee extension, 2/5 knee flexion, 3/5 APF/ADF LLE 1-2/5 hip flexion, hip abduction and hip internal rotation; 1-2/5 knee extension, 1-2/5 knee flexion, 3/5 APF/ADF Sensory: Decreased temp sensation and dysesthesia to temp circumferentially  to BLE with sensory level at T10 on the left and L1 on the right. Also noted is severe proprioceptive deficits to her toes.  Deep Tendon Reflexes: Absent achilles and patellar reflexes bilaterally. Trace biceps, triceps and brachioradialis bilaterally. Toes downgoing.  Cerebellar: Moderate ataxia with FNF on the left is unchanged since yesterday, but worsened since Monday. No ataxia on the right. Unable to assess H-S due to weakness.  Gait: Unable to assess  Lab Results: Results for orders placed or performed during the hospital encounter of 07/23/22 (from the past 48 hour(s))  Basic metabolic panel     Status: Abnormal   Collection Time: 07/28/22  5:50 AM  Result Value Ref Range   Sodium 135 135 - 145 mmol/L   Potassium 4.0 3.5 - 5.1 mmol/L   Chloride 105 98 - 111 mmol/L   CO2 23 22 - 32 mmol/L   Glucose, Bld 241 (H) 70 - 99 mg/dL    Comment: Glucose reference range applies only to samples taken after fasting for at least 8 hours.   BUN 15 8 - 23 mg/dL   Creatinine, Ser 1.13 (H) 0.44 - 1.00 mg/dL   Calcium 9.1 8.9 - 10.3 mg/dL   GFR, Estimated 52 (L) >60 mL/min    Comment: (NOTE) Calculated using the CKD-EPI Creatinine Equation (2021)    Anion gap 7 5 - 15    Comment: Performed at [facility], [address], [place], [place] [address]  CBC     Status: None   Collection Time: 07/28/22  5:50 AM  Result Value Ref Range   WBC 5.7 4.0 - 10.5 K/uL   RBC 4.71 3.87 - 5.11 MIL/uL   Hemoglobin 13.1 12.0 - 15.0 g/dL   HCT 40.1 36.0 - 46.0 %   MCV 85.1 80.0 - 100.0 fL   MCH 27.8 26.0 - 34.0 pg   MCHC 32.7 30.0 - 36.0 g/dL   RDW 14.9 11.5 - 15.5 %   Platelets 326 150 - 400 K/uL   nRBC 0.0 0.0 - 0.2 %    Comment: Performed at [facility], [address] [place], [place] [address]  Glucose, CSF     Status: Abnormal   Collection Time: 07/28/22 10:33 AM  Result Value Ref Range   Glucose, CSF 166 (H) 40 - 70 mg/dL    Comment: Performed at [facility], [address] [place], [place] [address]  Protein, CSF     Status: Abnormal   Collection Time: 07/28/22 10:33 AM  Result Value Ref Range   Total  Protein, CSF 373 (H) 15 - 45 mg/dL    Comment: RESULT CONFIRMED BY MANUAL DILUTION DAS Performed at [facility], [address] [place], [place] [address]   CSF cell count with differential     Status: Abnormal   Collection Time: 07/28/22 10:33 AM  Result Value Ref Range   Tube # 3    Color, CSF STRAW (A) COLORLESS   Appearance, CSF CLEAR CLEAR   Supernatant NOT INDICATED    RBC Count, CSF 55 (H) 0 /cu mm   WBC, CSF 26 (HH) 0 - 5 /cu mm    Comment: CRITICAL RESULT CALLED TO, READ BACK BY AND VERIFIED WITH: VERA WILLIAMS RN 1200 07/28/22 HNM    Segmented Neutrophils-CSF 0 0 - 6 %   Lymphs, CSF 98 (H) 40 - 80 %   Monocyte-Macrophage-Spinal Fluid 2 (L) 15 - 45 %   Eosinophils, CSF 0 0 - 1 %    Comment: Performed at [facility], [address], [place], [place] [address]  Pathologist smear review     Status: None   Collection Time: 07/28/22 10:33 AM  Result Value Ref Range   Path Review Cytospin of cerebrospinal fluid is reviewed.     Comment: Patient history of uveal melanoma is noted. No malignancy identified. Lymphocytic pleocytosis, with approximately 25 lymphocytes per microliter and total nucleated cell count of 26 per microliter. Minimal peripheral blood contamination/RBCs. Reviewed by Kathi Simpers, M.D. Performed at [facility], [address] [place], [place] [address]   Meningitis/Encephalitis Panel (CSF)     Status: None   Collection Time: 07/28/22 10:33 AM  Result Value Ref Range   Cryptococcus neoformans/gattii (CSF) NOT DETECTED NOT DETECTED    Comment: (NOTE) Patients with a suspicion of cryptococcal meningitis should be tested  for cryptococcal antigen (CrAg).      Cytomegalovirus (CSF) NOT DETECTED NOT DETECTED   Enterovirus (CSF) NOT DETECTED NOT DETECTED   Escherichia coli K1  (CSF) NOT DETECTED NOT DETECTED    Comment: (NOTE) Only E. coli strains possessing the K1 capsular antigen will be detected.      Haemophilus influenzae (CSF) NOT DETECTED NOT DETECTED   Herpes simplex virus 1 (CSF) NOT DETECTED NOT DETECTED   Herpes simplex virus 2 (CSF) NOT DETECTED NOT DETECTED   Human herpesvirus 6 (CSF) NOT DETECTED NOT DETECTED   Human parechovirus (CSF) NOT  DETECTED NOT DETECTED   Listeria monocytogenes (CSF) NOT DETECTED NOT DETECTED   Neisseria meningitis (CSF) NOT DETECTED NOT DETECTED    Comment: (NOTE) Only encapsulated strains of N. meningitidis will be detected.     Streptococcus agalactiae (CSF) NOT DETECTED NOT DETECTED   Streptococcus pneumoniae (CSF) NOT DETECTED NOT DETECTED   Varicella zoster virus (CSF) NOT DETECTED NOT DETECTED    Comment: Performed at [facility] [address], [place], [place] [address]  CSF culture w Gram Stain     Status: None (Preliminary result)   Collection Time: 07/28/22  1:30 PM   Specimen: CSF; Cerebrospinal Fluid  Result Value Ref Range   Specimen Description      CSF Performed at [facility], [address], [place], [place] [address]    Special Requests      NONE Performed at [facility], [place], [place] [address]    Gram Stain      NO ORGANISMS SEEN WBC SEEN RED BLOOD CELLS NONE SEEN Performed at [facility], [address] [place], [place] [address]    Culture      NO GROWTH < 24 HOURS Performed at [facility], [place] [address] [place], [place] [address]    Report Status PENDING   Lyme Disease Serology w/Reflex     Status: None   Collection Time: 07/28/22  1:30 PM  Result Value Ref Range   Lyme Total Antibody EIA Negative Negative    Comment: (NOTE) Lyme antibodies not detected. Reflex testing is not indicated. No laboratory evidence of infection with B. burgdorferi (Lyme disease). Negative results may occur in patients recently  infected (less than or equal to 14 days) with B. burgdorferi.  If recent infection is suspected, repeat testing on a new sample collected in 7 to 14 days is recommended. Performed At: [facility] [place], [place] [id number] Rush Farmer MD GT:[id number]   Basic metabolic panel     Status: Abnormal   Collection Time: 07/29/22  3:58 AM  Result Value Ref Range   Sodium 134 (L) 135 - 145 mmol/L   Potassium 4.6 3.5 - 5.1 mmol/L   Chloride 107 98 - 111 mmol/L   CO2 22 22 - 32 mmol/L   Glucose, Bld 208 (H) 70 - 99 mg/dL    Comment: Glucose reference range applies only to samples taken after fasting for at least 8 hours.   BUN 18 8 - 23 mg/dL   Creatinine, Ser 0.96 0.44 - 1.00 mg/dL   Calcium 9.3 8.9 - 10.3 mg/dL   GFR, Estimated >60 >60 mL/min    Comment: (NOTE) Calculated using the CKD-EPI Creatinine Equation (2021)    Anion gap 5 5 - 15    Comment: Performed at [facility], [address] [place], [place] [address]  CBC     Status: None   Collection Time: 07/29/22  3:58 AM  Result Value Ref Range   WBC 5.9 4.0 - 10.5 K/uL   RBC 4.79 3.87 - 5.11 MIL/uL   Hemoglobin 13.1 12.0 - 15.0 g/dL   HCT 40.6 36.0 - 46.0 %   MCV 84.8 80.0 - 100.0 fL   MCH 27.3 26.0 - 34.0 pg   MCHC 32.3 30.0 - 36.0 g/dL   RDW 14.6 11.5 - 15.5 %   Platelets 358 150 - 400 K/uL   nRBC 0.0 0.0 - 0.2 %    Comment: Performed at [facility], [place], [place] [address]  Sedimentation rate     Status: None   Collection Time: 07/29/22  2:12 PM  Result Value Ref Range   Sed Rate 5 0 - 30 mm/hr    Comment: Performed at [facility], [address] [place], [place] [address]    Recent Results (from the past 240 hour(s))  Resp Panel by RT-PCR (Flu A&B, Covid) Anterior Nasal Swab     Status: None   Collection Time: 07/23/22 12:06 PM   Specimen: Anterior Nasal Swab  Result Value Ref Range Status   SARS Coronavirus 2 by RT PCR NEGATIVE  NEGATIVE Final    Comment: (NOTE) SARS-CoV-2 target nucleic acids are NOT DETECTED.  The SARS-CoV-2 RNA is generally detectable in upper respiratory specimens during the acute phase of infection. The lowest concentration of SARS-CoV-2 viral copies this assay can detect is 138 copies/mL. A negative result does not preclude SARS-Cov-2 infection and should not be used as the sole basis for treatment or other patient management decisions. A negative result may occur with  improper specimen collection/handling, submission of specimen other than nasopharyngeal swab, presence of viral mutation(s) within the areas targeted by this assay, and inadequate number of viral copies(<138 copies/mL). A negative result must be combined with clinical observations, patient history, and epidemiological information. The expected result is Negative.  Fact Sheet for Patients:  EntrepreneurPulse.com.au  Fact Sheet for Healthcare Providers:  IncredibleEmployment.be  This test is no t yet approved or cleared by the [place] FDA and  has been authorized for detection and/or diagnosis of SARS-CoV-2 by FDA under an Emergency Use Authorization (EUA). This EUA will remain  in effect (meaning this test can be used) for the duration of the COVID-19 declaration under Section 564(b)(1) of the Act, 21 U.S.C.section 360bbb-3(b)(1), unless the authorization is terminated  or revoked sooner.       Influenza A by PCR NEGATIVE NEGATIVE Final   Influenza B by PCR NEGATIVE NEGATIVE Final    Comment: (NOTE) The Xpert Xpress SARS-CoV-2/FLU/RSV plus assay is intended as an aid in the diagnosis of influenza from Nasopharyngeal swab specimens and should not be used as a sole basis for treatment. Nasal washings and aspirates are unacceptable for Xpert Xpress SARS-CoV-2/FLU/RSV testing.  Fact Sheet for Patients: EntrepreneurPulse.com.au  Fact Sheet for Healthcare  Providers: IncredibleEmployment.be  This test is not yet approved or cleared by the [place] FDA and has been authorized for detection and/or diagnosis of SARS-CoV-2 by FDA under an Emergency Use Authorization (EUA). This EUA will remain in effect (meaning this test can be used) for the duration of the COVID-19 declaration under Section 564(b)(1) of the Act, 21 U.S.C. section 360bbb-3(b)(1), unless the authorization is terminated or revoked.  Performed at [facility], [address] [place], [place] [address]   CSF culture w Gram Stain     Status: None (Preliminary result)   Collection Time: 07/28/22  1:30 PM   Specimen: CSF; Cerebrospinal Fluid  Result Value Ref Range Status   Specimen Description   Final    CSF Performed at [facility], [address], [place], [place] [address]    Special Requests   Final    NONE Performed at [facility], [address] [place], [place] [address]    Gram Stain   Final    NO ORGANISMS SEEN WBC SEEN RED BLOOD CELLS NONE SEEN Performed at [facility], [address] [place], [place] [address]  Culture   Final    NO GROWTH < 24 HOURS Performed at [facility], [place] [address], [place], [place] [address]    Report Status PENDING  Incomplete    Lipid Panel No results for input(s): "CHOL", "TRIG", "HDL", "CHOLHDL", "VLDL", "LDLCALC" in the last 72 hours.  Studies/Results: DG FL GUIDED LUMBAR PUNCTURE  Result Date: 07/28/2022 CLINICAL DATA:  Weakness, evaluating for GBS EXAM: DIAGNOSTIC LUMBAR PUNCTURE UNDER FLUOROSCOPIC GUIDANCE COMPARISON:  None Available. FLUOROSCOPY: Radiation Exposure Index (as provided by the fluoroscopic device): 2.90 mGy Kerma PROCEDURE: Informed consent was obtained from the patient prior to the procedure, including potential complications of headache, allergy, and pain. With the patient prone, the lower back was prepped with Betadine. 1%  Lidocaine was used for local anesthesia. Lumbar puncture was performed at the L3-L4 level using a 20 gauge needle with return of yellow tinged CSF with an opening pressure of 22 cm water. 18 ml of CSF were obtained for laboratory studies. The patient tolerated the procedure well and there were no apparent complications. IMPRESSION: Technically successful lumbar puncture as described Read and performed by: Alexandria Lodge, PA-C Supervised by Yetta Glassman, MD, agree with above findings. Electronically Signed   By: Yetta Glassman M.D.   On: 07/28/2022 14:13    Medications: Scheduled:  amLODipine  5 mg Oral Daily   vitamin C  500 mg Oral BID   cholecalciferol  1,000 Units Oral Daily   enoxaparin (LOVENOX) injection  40 mg Subcutaneous Q24H   escitalopram  5 mg Oral Daily   feeding supplement  1 Container Oral TID BM   glipiZIDE  5 mg Oral BID AC   levothyroxine  100 mcg Oral Q0600   lidocaine (PF)  5 mL Intradermal Once   losartan  100 mg Oral Daily   methylPREDNISolone (SOLU-MEDROL) injection  67.5 mg Intravenous BID   metoprolol succinate  50 mg Oral Daily   multivitamin with minerals  1 tablet Oral Daily   pantoprazole  40 mg Oral BID AC   topiramate  25 mg Oral Daily   Assessment: 71 year old female with metastatic melanoma, presenting with progressive BLE weakness. Exam reveals quadriparesis with legs significantly weaker than arms, in conjunction with areflexic lower extremities and hyporeflexic upper extremities.   - Exam reveals motor findings best localizable to the lumbar nerve roots/cauda equina, most likely due to a motor neuropathy, including severe weakness and areflexia/hyporeflexia. Exam also reveals sensory findings best localizable to the lumbar nerve roots/cauda equina/lower extremity peripheral nerves in both a small and large fiber distribution. Overall presentation with rapid progression of symptom distribution and intensity is most consistent with a  polyradiculoneuropathy.  - Imaging studies: - MRI L-spine: No spinal canal stenosis or neural foraminal narrowing. Multilevel facet arthropathy, which can be a cause of back pain. Follow up postcontrast imaging reveals diffuse coating of the lower cord and cauda equina  - MRI brain: No acute or reversible finding. Mild age related volume loss. Minimal chronic small-vessel change of the hemispheric white matter. Single punctate focus of hemosiderin deposition in the left frontal white matter, not significant as an isolated finding. No sign of metastatic melanoma.  - MRI cervical spine w/wo contrast: Lesion with mild swelling in the left posterior cord which is enhancing and measures 4 mm. No intrinsic T1 shortening of this lesion. Prominent enhancement of intrathecal nerve roots diffusely. - MRI thoracic spine w/wo contrast:  Diffuse prominence of intrathecal nerve root enhancement. Small area of superficial  enhancement involving the right cord at the level of T3-4. - MRI scan also reveals a lung nodule - Vitamin B12 was low on admission at 291  - CSF labs resulted so far:  - Elevated WBC of 26 predominantly lymphocytic (98%) and with 0 segmented neutrophils, mildly elevated RBC consistent with traumatic tap, straw colored, clear  - CSF infectious PCR panel negative - CSF protein level markedly elevated at 373. 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 Not consistent with infection.  - Cytology reveals WBC only. No malignant cells identified per personal communication with ID, who has viewed the slides with Pathology.  - IgG index pending - Added VDRL yesterday  - Gram stain negative - Fungal/bacterial culture pending - Lyme titer to CSF labs yesterday, - Overall her clinical picture is not consistent with an infectious etiology despite the elevated CSF WBC, but will need ID for second opinion - DDx: Overall symptoms and  neuro exam findings were initially thought to be most consistent with GBS. However, imaging results and CSF cytology are most consistent with an autoimmune reaction to the patient's dual checkpoint inhibitor therapy for her metastatic melanoma.      Recommendations: - Oncology has recommended ESR, CRP, CPK, aldolase, then start solumedrol 43m/kg body weight IV QD or solumedrol 127mkg IV BID.  - Will monitor for improvement while on Solumedrol   LOS: 4 days   _0  signed: Dr. ErKerney Elbe0/01/2022  6:18 PM

## 2022-07-29 NOTE — Progress Notes (Signed)
   Date of Admission:  07/23/2022      ID: Sara Pittman is a 71 y.o. 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[place]) Active Problems:   DM II (diabetes mellitus, type II), controlled ([place])   Nausea & vomiting   GERD (gastroesophageal reflux disease)   Hypercalcemia   Hypothyroidism   Impaired ambulation   Severe major depression, single episode, without psychotic features ([place])   Uveal melanoma, anterior (H[facility])    Subjective: Pt c/o persisting weakness of lower extremities and left upper extremity No difficulty in swallowing or breathing  Medications:   amLODipine  5 mg Oral Daily   cholecalciferol  1,000 Units Oral Daily   enoxaparin (LOVENOX) injection  40 mg Subcutaneous Q24H   escitalopram  5 mg Oral Daily   feeding supplement  1 Container Oral TID BM   glipiZIDE  5 mg Oral BID AC   levothyroxine  100 mcg Oral Q0600   lidocaine (PF)  5 mL Intradermal Once   losartan  100 mg Oral Daily   methylPREDNISolone (SOLU-MEDROL) injection  67.5 mg Intravenous BID   metoprolol succinate  50 mg Oral Daily   multivitamin with minerals  1 tablet Oral Daily   pantoprazole  40 mg Oral BID AC   topiramate  25 mg Oral Daily    Objective: Vital signs in last 24 hours: Temp:  [98 F (36.7 C)-98.1 F (36.7 C)] 98 F (36.7 C) (10/04 1517) Pulse Rate:  [57-62] 57 (10/04 1517) Resp:  [16-18] 18 (10/04 1517) BP: (126-152)/(74-88) 126/74 (10/04 1517) SpO2:  [95 %-99 %] 99 % (10/04 1517)    PHYSICAL EXAM:  General: Alert, cooperative, no distress, appears stated age. pale Head: Normocephalic, without obvious abnormality, atraumatic. Eyes: does not close left yeye completely Left facial palsy' Lungs: Clear to auscultation bilaterally. No Wheezing or Rhonchi. No rales. Heart: s1s2. Abdomen: Soft, non-tender,not distended. Bowel sounds normal. No masses Extremities: atraumatic, no cyanosis. No edema. No clubbing Skin: No rashes or lesions. Or bruising Lymph:  Cervical, supraclavicular normal. Neurologic: lower extremeties- motor strength 2-/5 Left upper extremity 3-4/5  Lab Results Recent Labs    07/28/22 0550 07/29/22 0358  WBC 5.7 5.9  HGB 13.1 13.1  HCT 40.1 40.6  NA 135 134*  K 4.0 4.6  CL 105 107  CO2 23 22  BUN 15 18  CREATININE 1.13* 0.96   Liver Panel No results for input(s): "PROT", "ALBUMIN", "AST", "ALT", "ALKPHOS", "BILITOT", "BILIDIR", "IBILI" in the last 72 hours. Sedimentation Rate Recent Labs    07/29/22 1412  [place] 5  Microbiology:  Studies/Results:  MRI spine Prominent enhancement of intrathecal nerve roots diffusely  Assessment/Plan: Uveal melanoma with lung mets - was on immune therapy with checkpoint inhibitors- received 3 cycles so far  Polyradiculopathy  with sensory and motor defict involving b/l lower extremities, left UE and left facial palsy- progressing Likely due to adverse effect of ICI. 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

## 2022-07-29 NOTE — Progress Notes (Signed)
Patient is alert and oriented x4. She received morphine for pain x2. Denied additional needs.

## 2022-07-29 NOTE — Assessment & Plan Note (Signed)
Patient presented with bilateral lower extremity weakness and started having progressive weakness, new onset numbness and areflexia in the lower extremity and hyperreflexia in the upper extremities.  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  CSF studies negative for any acute infection and preliminary results with negative malignant cells.  Acute meningitis panel negative. Concern of either disease progression with leptomeningeal carcinomatosis or side effect of immunotherapy. Oncology at [facility] was also consulted and they are recommending starting her on Solu-Medrol and try to transfer her to [facility]. -Ordered Solu-Medrol 1 mg/kg twice daily. -Check CRP, ESR

## 2022-07-29 NOTE — Assessment & Plan Note (Signed)
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   -See above

## 2022-07-29 NOTE — Assessment & Plan Note (Signed)
Elevated TSH at 24.62.  Increase dose of Synthroid from 75 to 100 mcg. -Will need repeat test in 4 to 6 weeks

## 2022-07-30 DIAGNOSIS — C6941 Malignant neoplasm of right ciliary body: Secondary | ICD-10-CM | POA: Diagnosis not present

## 2022-07-30 DIAGNOSIS — G610 Guillain-Barre syndrome: Secondary | ICD-10-CM | POA: Diagnosis not present

## 2022-07-30 LAB — IGG CSF INDEX
Albumin CSF-mCnc: 192 mg/dL — ABNORMAL HIGH (ref 10–46)
Albumin: 4.3 g/dL (ref 3.8–4.8)
CSF IgG Index: 0.7 (ref 0.0–0.7)
IgG (Immunoglobin G), Serum: 774 mg/dL (ref 586–1602)
IgG, CSF: 23 mg/dL — ABNORMAL HIGH (ref 0.0–6.7)
IgG/Alb Ratio, CSF: 0.12 (ref 0.00–0.25)

## 2022-07-30 LAB — BASIC METABOLIC PANEL
Anion gap: 10 (ref 5–15)
BUN: 28 mg/dL — ABNORMAL HIGH (ref 8–23)
CO2: 20 mmol/L — ABNORMAL LOW (ref 22–32)
Calcium: 9.3 mg/dL (ref 8.9–10.3)
Chloride: 101 mmol/L (ref 98–111)
Creatinine, Ser: 1.13 mg/dL — ABNORMAL HIGH (ref 0.44–1.00)
GFR, Estimated: 52 mL/min — ABNORMAL LOW (ref >60)
Glucose, Bld: 342 mg/dL — ABNORMAL HIGH (ref 70–99)
Potassium: 4.5 mmol/L (ref 3.5–5.1)
Sodium: 131 mmol/L — ABNORMAL LOW (ref 135–145)

## 2022-07-30 LAB — GLUCOSE, CAPILLARY
Glucose-Capillary: 462 mg/dL — ABNORMAL HIGH (ref 70–99)
Glucose-Capillary: 397 mg/dL — ABNORMAL HIGH (ref 70–99)

## 2022-07-30 LAB — MISC LABCORP TEST (SEND OUT): Labcorp test code: 505310

## 2022-07-30 LAB — HEMOGLOBIN A1C
Hgb A1c MFr Bld: 6.6 % — ABNORMAL HIGH (ref 4.8–5.6)
Mean Plasma Glucose: 142.72 mg/dL

## 2022-07-30 LAB — ALDOLASE: Aldolase: 4.5 U/L (ref 3.3–10.3)

## 2022-07-30 LAB — VDRL, CSF: VDRL Quant, CSF: NONREACTIVE

## 2022-07-30 LAB — METHYLMALONIC ACID, SERUM: Methylmalonic Acid, Quantitative: 144 nmol/L (ref 0–378)

## 2022-07-30 LAB — LYME DISEASE DNA BY PCR(BORRELIA BURG): Lyme Disease(B.burgdorferi)PCR: NEGATIVE

## 2022-07-30 MED ORDER — INSULIN ASPART 100 UNIT/ML IJ SOLN: 0.0000 [IU] | Freq: Every day | INTRAMUSCULAR | Status: DC

## 2022-07-30 MED ORDER — LEVOTHYROXINE SODIUM 100 MCG PO TABS: 100.0000 ug | ORAL_TABLET | Freq: Every day | ORAL | Status: AC

## 2022-07-30 MED ORDER — INSULIN ASPART 100 UNIT/ML IJ SOLN
0.0000 [IU] | Freq: Three times a day (TID) | INTRAMUSCULAR | Status: DC
  Administered 2022-07-30: 15 [IU] via SUBCUTANEOUS
  Filled 2022-07-30: qty 1

## 2022-07-30 MED ORDER — INSULIN GLARGINE-YFGN 100 UNIT/ML ~~LOC~~ SOLN
5.0000 [IU] | Freq: Two times a day (BID) | SUBCUTANEOUS | Status: DC
  Filled 2022-07-30: qty 0.05

## 2022-07-30 MED ORDER — INSULIN ASPART 100 UNIT/ML IJ SOLN
5.0000 [IU] | Freq: Once | INTRAMUSCULAR | Status: AC
  Administered 2022-07-30: 5 [IU] via SUBCUTANEOUS

## 2022-07-30 MED ORDER — POLYETHYLENE GLYCOL 3350 17 G PO PACK
17.0000 g | PACK | Freq: Every day | ORAL | Status: DC
  Filled 2022-07-30: qty 1

## 2022-07-30 MED ORDER — ESCITALOPRAM OXALATE 5 MG PO TABS: 5.0000 mg | ORAL_TABLET | Freq: Every day | ORAL | Status: AC

## 2022-07-30 NOTE — Progress Notes (Addendum)
Given report to rebecca on carelink, cancelled [facility] pick up. [facility] came to pick up pt at 2031. Documents handed to [employer].

## 2022-07-30 NOTE — Assessment & Plan Note (Signed)
Patient presented with bilateral lower extremity weakness and started having progressive weakness, new onset numbness and areflexia in the lower extremity and hyperreflexia in the upper extremities.  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  CSF studies negative for any acute infection and preliminary results with negative malignant cells.  Acute meningitis panel negative. Concern of either disease progression with leptomeningeal carcinomatosis or side effect of immunotherapy. Oncology at [facility] was also consulted and they are recommending starting her on Solu-Medrol and try to transfer her to [facility]. -Continue Solu-Medrol 1 mg/kg twice daily. -Check CRP, ESR- normal

## 2022-07-30 NOTE — Progress Notes (Signed)
Occupational Therapy Treatment Patient Details Name: Sara Pittman MRN: [id number] DOB: May 15, 1951 Today's Date: 07/30/2022   History of present illness Pt is a 71 y/o F admitted on 07/23/22 after presenting with c/o BLE weakness for the past few days, falling twice, as well as nausea. Pt's BLE weakness is suspected 2/2 electrolyte abnormality. PMH: DM, GERD, heart murmur, HTN. 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 Patient found to have metastatic lung Ca and nodule on C2.   OT comments  Chart reviewed, pt greeted stating she doesn't "feel like doing much today". Tx session targeted improving activity tolerance for ADL task completion. Limited tolerance for static sitting on edge of bed on this date. Pt completed grooming tasks with SET UP in bed. Rn aware of pt status. OT will continue to follow acutely.    Recommendations for follow up therapy are one component of a multi-disciplinary discharge planning process, led by the attending physician.  Recommendations may be updated based on patient status, additional functional criteria and insurance authorization.    Follow Up Recommendations  Skilled nursing-short term rehab (<3 hours/day)    Assistance Recommended at Discharge Frequent or constant Supervision/Assistance  Patient can return home with the following      Equipment Recommendations  BSC/3in1;Tub/shower seat    Recommendations for Other Services      Precautions / Restrictions Precautions Precautions: Fall Restrictions Weight Bearing Restrictions: No       Mobility Bed Mobility Overal bed mobility: Needs Assistance Bed Mobility: Sit to Supine     Supine to sit: Max assist, HOB elevated Sit to supine: HOB elevated        Transfers                   General transfer comment: pt declined on this date     Balance Overall balance assessment: Needs assistance, History of Falls Sitting-balance support:  Feet supported Sitting balance-Leahy Scale: Poor Sitting balance - Comments: CGA at all times, limited tolerance for edge of bed sitting                                   ADL either performed or assessed with clinical judgement   ADL Overall ADL's : Needs assistance/impaired     Grooming: Sitting;Set up;Oral care;Wash/dry face               Lower Body Dressing: Maximal assistance                      Extremity/Trunk Assessment              Vision       Perception     Praxis      Cognition Arousal/Alertness: Awake/alert   Overall Cognitive Status: Within Functional Limits for tasks assessed                                          Exercises      Shoulder Instructions       General Comments      Pertinent Vitals/ Pain       Pain Assessment Pain Assessment: Faces Faces Pain Scale: Hurts even more Pain Location: Generalized Pain Descriptors / Indicators: Grimacing, Discomfort Pain Intervention(s): Limited activity within patient's tolerance, Monitored during session, Repositioned  Home Living                                          Prior Functioning/Environment              Frequency  Min 2X/week        Progress Toward Goals  OT Goals(current goals can now be found in the care plan section)  Progress towards OT goals: Progressing toward goals     Plan Discharge plan remains appropriate;Frequency remains appropriate    Co-evaluation                 AM-PAC OT "6 Clicks" Daily Activity     Outcome Measure   Help from another person eating meals?: A Little Help from another person taking care of personal grooming?: A Lot Help from another person toileting, which includes using toliet, bedpan, or urinal?: A Lot Help from another person bathing (including washing, rinsing, drying)?: A Lot Help from another person to put on and taking off regular upper body clothing?: A  Lot Help from another person to put on and taking off regular lower body clothing?: A Lot 6 Click Score: 13    End of Session    OT Visit Diagnosis: Repeated falls (R29.6);Muscle weakness (generalized) (M62.81)   Activity Tolerance Patient limited by fatigue   Patient Left in bed;with call bell/phone within reach;with bed alarm set   Nurse Communication Mobility status;Other (comment) (?facial droop)        Time: 1117-1130 OT Time Calculation (min): 13 min  Charges: OT Treatments $Self Care/Home Management : 8-22 mins  Shanon Payor, OTD OTR/L  07/30/22, 12:34 PM

## 2022-07-30 NOTE — Inpatient Diabetes Management (Signed)
Inpatient Diabetes Program Recommendations  AACE/ADA: New Consensus Statement on Inpatient Glycemic Control (2015)  Target Ranges:  Prepandial:   less than 140 mg/dL      Peak postprandial:   less than 180 mg/dL (1-2 hours)      Critically ill patients:  140 - 180 mg/dL   Lab Results  Component Value Date   GLUCAP 153 (H) 07/23/2022    Latest Reference Range & Units 07/29/22 03:58 07/30/22 03:35  Glucose 70 - 99 mg/dL 208 (H) 342 (H)  (H): Data is abnormally high   Diabetes history: DM2 Outpatient Diabetes medications: Glucotrol 10 mg bid, Trulicity 1.5 mg qweek, Metformin 1 gm bid Current orders for Inpatient glycemic control: Glucotrol 5 mg, Solumedrol 67.5 mg bid  Inpatient Diabetes Program Recommendations:   -Glycemic control order set with 0-15 units tid, 0-5 units hs -Semglee 10 units qd -D/C Glucotrol while in the hospital  Thank you, Nani Gasser. Gio Janoski, RN, MSN, CDE  Diabetes Coordinator Inpatient Glycemic Control Team Team Pager [phone] (8am-5pm) 07/30/2022 9:55 AM

## 2022-07-30 NOTE — Progress Notes (Signed)
  Date and time results received: 07/30/22 1632 (use smartphrase ".now" to insert current time)  Test: CBG  Critical Value: 462  Name of Provider Notified: Dr. Reesa Chew  Orders Received? Or Actions Taken?: Orders Received - See Orders for details per Dr. Reesa Chew give 20 units of novolog, will administer and continue to monitor.

## 2022-07-30 NOTE — Assessment & Plan Note (Signed)
CBG elevated as she is now on steroid. -Start Semeglee at 5 unit twice daily. -Moderate SSI

## 2022-07-30 NOTE — Progress Notes (Addendum)
Nutrition Follow-up  DOCUMENTATION CODES:   Not applicable  INTERVENTION:   Boost Breeze po TID, each supplement provides 250 kcal and 9 grams of protein  Add Magic cup TID with meals, each supplement provides 290 kcal and 9 grams of protein  MVI po daily   Vitamin C 564m po BID  Cholecalciferol 1000 units po daily    Copper lab pending   Pt at high refeed risk; recommend monitor potassium, magnesium and phosphorus labs daily until stable  NUTRITION DIAGNOSIS:   Inadequate oral intake related to poor appetite as evidenced by per patient/family report. 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  Met with pt in room today. Pt reports fair appetite and oral intake in hospital; pt reports that she is eating about 50% of meals in hospital. Pt is documented to be eating 10-50% of meals. Pt reports that she is drinking all 3 Boost Breeze; pt declines Ensure supplements. Vitamin labs sent out on patient as pt with neurological concerns. Vitamins that have returned low are being supplemented. Copper lab has not yet returned. Per chart, pt remains fairly weight stable since admission. [facility] Pt remains at high refeed risk. No BM noted since 9/30; MD aware.   Medications reviewed and include: vitamin C, D3, loveno, glipizide, synthroid, solu-medrol, MVI, protonix, miralax  Labs reviewed: Na 131(L), K 4.5 wnl, BUN 28(H), creat 1.13(H) P 4.1 wnl- 9/29 Mg 2.3 wnl- 10/1 Folate 13.2, B1 112.0, vitamin D 24.36(L), B6 5.8, vitamin C 0.2(L), vitamin E 11.8- 9/29 B12- 1233(H)- 10/2  Diet Order:   Diet Order             Diet regular Room service appropriate?  Yes; Fluid consistency: Thin  Diet effective now                  EDUCATION NEEDS:   Education needs have been addressed  Skin:  Skin Assessment: Reviewed RN Assessment  Last BM:  9/30  Height:   Ht Readings from Last 1 Encounters:  07/23/22 5' 7"  (1.702 m)    Weight:   Wt Readings from Last 1 Encounters:  07/23/22 66.2 kg    Ideal Body Weight:  67.3 kg  BMI:  Body mass index is 22.87 kg/m.  Estimated Nutritional Needs:   Kcal:  1700-1900kcal/day  Protein:  85-95g/day  Fluid:  1.7-1.9L/day  CKoleen DistanceMS, RD, LDN Please refer to [facility] RD and/or RD on-call/weekend/after hours pager

## 2022-07-30 NOTE — Progress Notes (Signed)
Progress Note   Patient: Sara Pittman KZS:[id number] DOB: 08-14-1951 DOA: 07/23/2022     5 DOS: the patient was seen and examined on 07/30/2022   Brief hospital course: 71 year old female with a known history of stage IV UVL melanoma undergoing immunotherapy at [place] with Dr. Rico Ala, oncology is admitted for lower extremity weakness  9/30: Still feeling weak, nausea 10/1: Hypokalemia.  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  CSF studies pending, ID consult.  10/4: Patient with worsening lower extremity weakness and no sensation.  Now started having left upper extremity tingling, numbness and weakness.  Preliminary LP labs with no infection or malignant cells.  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 ID was able to contact her oncologist Dr. Rico Ala at [facility], who are recommending transfer to [place], obtaining CRP, ESR and Aldolase, starting her on high-dose steroid. Neurology and ID both are on board. Transfer process initiated. Patient is being started on Solu-Medrol 1 mg/kg twice daily.  10/5: CSF culture remain negative.  ESR, CRP and CK levels are within normal limits.  Aldolase pending. CBG significantly elevated secondary to steroid, starting her on basal and short-acting insulin. Continue to have no sensations or strength in lower extremities, worsening left upper extremity strength and numbness. 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  She also has numbness in the  left upper extremity in her lower extremities  MRI of the brain and lumbar spine did not show any acute pathology  -Discussed with neurology -her findings are concerning for GBS and or polyradiculoneuropathy-S/p IR guided lumbar puncture.  CSF studies negative for any acute infection and preliminary results with negative malignant cells.  Acute meningitis panel negative. Concern of either disease progression with leptomeningeal carcinomatosis or side effect of immunotherapy. Oncology at [facility] was also consulted and they are recommending starting her on Solu-Medrol and try to transfer her to [facility]. -Continue Solu-Medrol 1 mg/kg twice daily. 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   -See above  Uveal melanoma, anterior (HCC) Patient with history of stage IV uveal melanoma s/p immunotherapy at [place] with Dr. Rico Ala. Might be experiencing disease progression with leptomeningeal carcinomatosis versus side effect of immunotherapy causing GBS. Discussed with her on oncologist and who will prefer her to be transferred to [facility]. -Transfer process initiated  Severe major depression, single episode, without psychotic features ([place]) Seen by psychiatry.  Lexapro started on 10/2.  No further suicidal ideation  DM II (diabetes mellitus, type II), controlled ([place]) CBG elevated as she is now on steroid. -Start Semeglee at 5 unit twice daily. -Moderate SSI  GERD (gastroesophageal reflux disease) Continue PPI  Nausea & vomiting Related to GERD/ suspect underlying esophagitis. Continue Protonix.  She is tolerating diet and not having any vomiting.  Hypercalcemia Now normalized status post IV hydration.   Hypothyroidism Elevated TSH at 24.62.  Increase dose of Synthroid from 75 to 100 mcg. -Will need repeat test in 4 to 6 weeks     Subjective: Patient continued to have no sensations or strength in bilateral lower extremities.  Worsening  numbness and weakness of left upper extremity.  Physical Exam: Vitals:   07/30/22 0446 07/30/22 0841 07/30/22 1523 07/30/22 1554  BP: 131/68 130/68  (!) 116/56  Pulse: 65 63  66  Resp: 18 18  18   Temp: 98.1 F (36.7 C) 98 F (36.7 C)  98.4 F (36.9 C)  TempSrc: Oral Oral  Oral  SpO2: 99% 99%  98%  Weight:   65.7 kg   Height:       General.  Well-developed lady, in no acute distress. Pulmonary.  Lungs clear bilaterally, normal respiratory effort. CV.  Regular rate and rhythm, no JVD, rub or murmur. Abdomen.  Soft, nontender, nondistended, BS positive. CNS.  Alert and oriented .  Left facial droop, 2/5 in left upper extremity, 0/5 in bilateral lower extremities, altered sensation involving all extremities. Extremities.  No edema, no cyanosis, pulses intact and symmetrical. Psychiatry.  Judgment and insight appears normal.  Data Reviewed: Prior data reviewed  Family Communication: Discussed with sister on phone.  Disposition: Status is: Inpatient Remains inpatient appropriate because: Severity of illness   Planned Discharge Destination: Need transfer to [facility]  DVT prophylaxis.  Lovenox Time spent: 50 minutes  This record has been created using Systems analyst. Errors have been sought and corrected,but may not always be located. Such creation errors do not reflect on the standard of care.  Author: Lorella Nimrod, MD 07/30/2022 4:04 PM  For on call review [email or web address].

## 2022-07-30 NOTE — Progress Notes (Signed)
Pt to be transferred to [facility] room 9323, phone number  [phone]. Report called to [facility], Awaiting transportation. Per [facility] will put her on their waitlist. Per [place] will assess after change of shift and will call us back with a time that they can transport pt. Will pass to nightshift.

## 2022-07-30 NOTE — Plan of Care (Signed)

## 2022-07-30 NOTE — Discharge Summary (Signed)
Physician Discharge Summary   Patient: Sara LOVECCHIO MRN: [id number] DOB: 09-02-1951  Admit date:     07/23/2022  Discharge date: 07/30/2022  Discharge Physician: Lorella Nimrod   PCP: Tracie Harrier, MD   Recommendations at discharge:  Patient is being discharged to [facility] for further management.  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 *  Hospital Course: 71 year old female with a known history of stage IV UVL melanoma undergoing immunotherapy at [facility] with Dr. Rico Ala, oncology is admitted for lower extremity weakness  9/30: Still feeling weak, nausea 10/1: Hypokalemia.  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  CSF studies pending, ID consult.  10/4: Patient with worsening lower extremity weakness and no sensation.  Now started having left upper extremity tingling, numbness and weakness.  Preliminary LP labs with no infection or malignant cells.  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 ID was able to contact her oncologist Dr. Rico Ala at [facility], who are recommending transfer to [place], obtaining CRP, ESR and Aldolase, starting her on high-dose steroid. Neurology and ID both are on board. Transfer process initiated. Patient is being started on Solu-Medrol 1 mg/kg twice daily.  10/5: CSF culture remain negative.  ESR, CRP and CK levels are within  normal limits.  Aldolase pending. CBG significantly elevated secondary to steroid, starting her on basal and short-acting insulin. Continue to have no sensations or strength in lower extremities, worsening left upper extremity strength and numbness. Pending bed availability at [facility]-hopefully can go today.  Patient was able to find a bed at [facility] where she is being discharged for further management.  Assessment and Plan: * Polyradiculoneuropathy [facility]) Patient presented with bilateral lower extremity weakness and started having progressive weakness, new onset numbness and areflexia in the lower extremity and hyperreflexia in the upper extremities.  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  CSF studies negative for any acute infection and preliminary results with negative malignant cells.  Acute meningitis panel negative. Concern of either disease progression with leptomeningeal carcinomatosis or side effect of immunotherapy. Oncology at [facility] was also consulted and they are recommending starting her on Solu-Medrol and try to transfer her to [facility]. -Continue Solu-Medrol 1 mg/kg twice daily. 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   -See above  Uveal melanoma, anterior (HCC) Patient with history of stage IV uveal melanoma s/p immunotherapy at [facility] with Dr. Rico Ala. Might be experiencing disease progression with leptomeningeal carcinomatosis versus side effect of immunotherapy causing GBS. Discussed with her on oncologist and who will prefer her to be transferred to [facility]. -Transfer process initiated  Severe major depression, single episode, without psychotic features ([place]) Seen by psychiatry.  Lexapro started on 10/2.  No further suicidal ideation  DM II (diabetes mellitus, type II), controlled ([place]) CBG elevated as she is now on steroid. -Start Semeglee at 5 unit twice daily. -Moderate SSI  GERD (gastroesophageal reflux disease) Continue PPI  Nausea & vomiting Related to GERD/ suspect underlying esophagitis. Continue Protonix.  She is tolerating diet and not having any vomiting.  Hypercalcemia Now normalized status post IV hydration.   Hypothyroidism Elevated TSH at 24.62.  Increase dose of Synthroid from 75 to 100 mcg. -Will need repeat test in 4 to 6 weeks         Consultants: Neurology, infectious disease. Procedures performed: None Disposition:  [facility] Diet recommendation:  Discharge Diet Orders (From admission, onward)     Start     Ordered   07/30/22 0000  Diet - low sodium heart healthy        07/30/22 1753            DISCHARGE MEDICATION: Allergies as of 07/30/2022       Reactions   Crestor [rosuvastatin] Other (See Comments)   All over aches        Medication List     STOP taking these medications    aspirin EC 81 MG tablet   cholecalciferol 1000 units tablet Commonly known as: VITAMIN D       TAKE these medications    amLODipine 2.5 MG tablet Commonly known as: NORVASC Take 2.5 mg by mouth daily.   CALCIUM-VITAMIN D PO Take 1 tablet by mouth daily.   Dulaglutide 1.5 MG/0.5ML Sopn Inject into the skin.   escitalopram 5 MG tablet Commonly known as: LEXAPRO Take 1 tablet (5 mg total) by mouth daily.   glipiZIDE 10 MG tablet Commonly known as: GLUCOTROL Take 10 mg by mouth 2 (two) times daily before a meal.   HYDROcodone-acetaminophen 5-325 MG tablet Commonly known as: NORCO/VICODIN Take 1 tablet by mouth every 8 (eight) hours as needed for pain.   levothyroxine 100 MCG tablet Commonly known as: SYNTHROID Take 1 tablet (100 mcg total) by mouth daily at 6 (six) AM. What changed:  medication strength how much to  take when to take this   losartan 100 MG tablet Commonly known as: COZAAR Take 100 mg by mouth daily.   metFORMIN 1000 MG tablet Commonly known as: GLUCOPHAGE Take 1,000 mg by mouth 2 (two) times daily with a meal.   metoprolol succinate 50 MG 24 hr tablet Commonly known as: TOPROL-XL Take 1 tablet by mouth daily.   omeprazole 20 MG capsule Commonly known as: PRILOSEC Take 20 mg by mouth 2 (two) times daily before a meal.   simvastatin 80 MG tablet Commonly known as: ZOCOR Take 80 mg by mouth at bedtime.   topiramate 25 MG tablet Commonly known as: TOPAMAX Take by mouth.   traMADol 50 MG tablet Commonly known as: ULTRAM Take 50 mg by mouth every 6 (six) hours as needed for pain.        Contact information for follow-up providers     [place] [facility] Follow up.   Why: [facility] will reach out to you to set up services after discharge.             Contact information for after-discharge care     Destination     [facility] Preferred SNF .   Service: Skilled Chiropodist information: [address] [place] [phone]  Discharge Exam: Filed Weights   07/23/22 1200 07/30/22 1523  Weight: 66.2 kg 65.7 kg   General.  Well-developed lady, in no acute distress. Pulmonary.  Lungs clear bilaterally, normal respiratory effort. CV.  Regular rate and rhythm, no JVD, rub or murmur. Abdomen.  Soft, nontender, nondistended, BS positive. CNS.  Alert and oriented .  Left facial droop, 2/5 in left upper extremity, 0/5 in bilateral lower extremities, altered sensation involving all extremities. Extremities.  No edema, no cyanosis, pulses intact and symmetrical. Psychiatry.  Judgment and insight appears normal.  Condition at discharge: stable  The results of significant diagnostics from this hospitalization (including imaging, microbiology, ancillary and laboratory) are listed below for  reference.   Imaging Studies: DG FL GUIDED LUMBAR PUNCTURE  Result Date: 07/28/2022 CLINICAL DATA:  Weakness, evaluating for GBS EXAM: DIAGNOSTIC LUMBAR PUNCTURE UNDER FLUOROSCOPIC GUIDANCE COMPARISON:  None Available. FLUOROSCOPY: Radiation Exposure Index (as provided by the fluoroscopic device): 2.90 mGy Kerma PROCEDURE: Informed consent was obtained from the patient prior to the procedure, including potential complications of headache, allergy, and pain. With the patient prone, the lower back was prepped with Betadine. 1% Lidocaine was used for local anesthesia. Lumbar puncture was performed at the L3-L4 level using a 20 gauge needle with return of yellow tinged CSF with an opening pressure of 22 cm water. 18 ml of CSF were obtained for laboratory studies. The patient tolerated the procedure well and there were no apparent complications. IMPRESSION: Technically successful lumbar puncture as described Read and performed by: Alexandria Lodge, PA-C Supervised by Yetta Glassman, MD, agree with above findings. Electronically Signed   By: Yetta Glassman M.D.   On: 07/28/2022 14:13   MR LUMBAR SPINE W CONTRAST  Result Date: 07/27/2022 CLINICAL DATA:  History of uveal melanoma undergoing immunotherapy. Lower extremity weakness EXAM: MRI CERVICAL, THORACIC WITHOUT AND WITH CONTRAST MRI LUMBAR SPINE WITH CONTRAST TECHNIQUE: Multiplanar and multiecho pulse sequences of the cervical spine, to include the craniocervical junction and cervicothoracic junction, and thoracic and lumbar spine, were obtained with intravenous contrast. Precontrast sequences were obtained through the cervical and thoracic. CONTRAST:  66m GADAVIST GADOBUTROL 1 MMOL/ML IV SOLN COMPARISON:  None Available. FINDINGS: MRI CERVICAL SPINE FINDINGS Alignment: Normal Vertebrae: No fracture, evidence of discitis, or bone lesion. Cord: Lesion with mild swelling in the left posterior cord which is enhancing and measures 4 mm. No intrinsic T1  shortening of this lesion. Prominent enhancement of intrathecal nerve roots diffusely. Posterior Fossa, vertebral arteries, paraspinal tissues: Negative Disc levels: Ordinary disc desiccation and narrowing that is diffuse. No neural impingement. Root sleeve cyst at the left C7-T1 foramen. MRI THORACIC SPINE FINDINGS Alignment:  Normal Vertebrae: No fracture, evidence of discitis, or bone lesion. Cord: Diffuse prominence of intrathecal nerve root enhancement. Small area of superficial enhancement involving the right cord at the level of T3-4. Paraspinal and other soft tissues: Nodule in the left lung measuring 17 mm peer Disc levels: Ordinary spondylitic and facet spurring the lower thoracic spine. MRI LUMBAR SPINE FINDINGS Segmentation:  5 lumbar type vertebrae Alignment:  Normal Vertebrae:  No fracture, evidence of discitis, or bone lesion. Conus medullaris and cauda equina: Conus extends to the L1 level. Diffuse coating of the lower cord and cauda equina Paraspinal and other soft tissues: Negative Disc levels: L5-S1 disc degeneration IMPRESSION: 1. 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 A parenchymal based nodule is also seen in the left posterior cord at C2. 2. Suspicious  nodule in the left lung, please correlate with any outside chest CT. 3. Consider postcontrast imaging of the brain given the pattern of disease. 4. Postcontrast only lumbar imaging. Electronically Signed   By: Jorje Guild M.D.   On: 07/27/2022 18:59   MR CERVICAL SPINE W WO CONTRAST  Result Date: 07/27/2022 CLINICAL DATA:  History of uveal melanoma undergoing immunotherapy. Lower extremity weakness EXAM: MRI CERVICAL, THORACIC WITHOUT AND WITH CONTRAST MRI LUMBAR SPINE WITH CONTRAST TECHNIQUE: Multiplanar and multiecho pulse sequences of the cervical spine, to include the craniocervical junction and cervicothoracic junction, and thoracic and lumbar  spine, were obtained with intravenous contrast. Precontrast sequences were obtained through the cervical and thoracic. CONTRAST:  11m GADAVIST GADOBUTROL 1 MMOL/ML IV SOLN COMPARISON:  None Available. FINDINGS: MRI CERVICAL SPINE FINDINGS Alignment: Normal Vertebrae: No fracture, evidence of discitis, or bone lesion. Cord: Lesion with mild swelling in the left posterior cord which is enhancing and measures 4 mm. No intrinsic T1 shortening of this lesion. Prominent enhancement of intrathecal nerve roots diffusely. Posterior Fossa, vertebral arteries, paraspinal tissues: Negative Disc levels: Ordinary disc desiccation and narrowing that is diffuse. No neural impingement. Root sleeve cyst at the left C7-T1 foramen. MRI THORACIC SPINE FINDINGS Alignment:  Normal Vertebrae: No fracture, evidence of discitis, or bone lesion. Cord: Diffuse prominence of intrathecal nerve root enhancement. Small area of superficial enhancement involving the right cord at the level of T3-4. Paraspinal and other soft tissues: Nodule in the left lung measuring 17 mm peer Disc levels: Ordinary spondylitic and facet spurring the lower thoracic spine. MRI LUMBAR SPINE FINDINGS Segmentation:  5 lumbar type vertebrae Alignment:  Normal Vertebrae:  No fracture, evidence of discitis, or bone lesion. Conus medullaris and cauda equina: Conus extends to the L1 level. Diffuse coating of the lower cord and cauda equina Paraspinal and other soft tissues: Negative Disc levels: L5-S1 disc degeneration IMPRESSION: 1. 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 A parenchymal based nodule is also seen in the left posterior cord at C2. 2. Suspicious nodule in the left lung, please correlate with any outside chest CT. 3. Consider postcontrast imaging of the brain given the pattern of disease. 4. Postcontrast only lumbar imaging. Electronically Signed   By: JJorje GuildM.D.    On: 07/27/2022 18:59   MR THORACIC SPINE W WO CONTRAST  Result Date: 07/27/2022 CLINICAL DATA:  History of uveal melanoma undergoing immunotherapy. Lower extremity weakness EXAM: MRI CERVICAL, THORACIC WITHOUT AND WITH CONTRAST MRI LUMBAR SPINE WITH CONTRAST TECHNIQUE: Multiplanar and multiecho pulse sequences of the cervical spine, to include the craniocervical junction and cervicothoracic junction, and thoracic and lumbar spine, were obtained with intravenous contrast. Precontrast sequences were obtained through the cervical and thoracic. CONTRAST:  664mGADAVIST GADOBUTROL 1 MMOL/ML IV SOLN COMPARISON:  None Available. FINDINGS: MRI CERVICAL SPINE FINDINGS Alignment: Normal Vertebrae: No fracture, evidence of discitis, or bone lesion. Cord: Lesion with mild swelling in the left posterior cord which is enhancing and measures 4 mm. No intrinsic T1 shortening of this lesion. Prominent enhancement of intrathecal nerve roots diffusely. Posterior Fossa, vertebral arteries, paraspinal tissues: Negative Disc levels: Ordinary disc desiccation and narrowing that is diffuse. No neural impingement. Root sleeve cyst at the left C7-T1 foramen. MRI THORACIC SPINE FINDINGS Alignment:  Normal Vertebrae: No fracture, evidence of discitis, or bone lesion. Cord: Diffuse prominence of intrathecal nerve root enhancement. Small area of superficial enhancement involving the right cord at the level of T3-4.  Paraspinal and other soft tissues: Nodule in the left lung measuring 17 mm peer Disc levels: Ordinary spondylitic and facet spurring the lower thoracic spine. MRI LUMBAR SPINE FINDINGS Segmentation:  5 lumbar type vertebrae Alignment:  Normal Vertebrae:  No fracture, evidence of discitis, or bone lesion. Conus medullaris and cauda equina: Conus extends to the L1 level. Diffuse coating of the lower cord and cauda equina Paraspinal and other soft tissues: Negative Disc levels: L5-S1 disc degeneration IMPRESSION: 1. Extensive abnormal  nerve root enhancement as seen with leptomeningeal carcinomatosis, consider confirmation with CSF histology. A parenchymal based nodule is also seen in the left posterior cord at C2. 2. Suspicious nodule in the left lung, please correlate with any outside chest CT. 3. Consider postcontrast imaging of the brain given the pattern of disease. 4. Postcontrast only lumbar imaging. Electronically Signed   By: Jorje Guild M.D.   On: 07/27/2022 18:59   MR BRAIN WO CONTRAST  Result Date: 07/27/2022 CLINICAL DATA:  Neuro deficit, acute, stroke suspected. History of melanoma with immunotherapy. EXAM: MRI HEAD WITHOUT CONTRAST TECHNIQUE: Multiplanar, multiecho pulse sequences of the brain and surrounding structures were obtained without intravenous contrast. COMPARISON:  Head CT 07/23/2022 FINDINGS: Brain: Diffusion imaging does not show any acute or subacute infarction. No focal abnormality is seen affecting the brainstem or cerebellum. Cerebral hemispheres show mild age related volume loss with a few minor changes of chronic small vessel disease of the white matter. No cortical or large vessel territory infarction. No evidence of mass or vasogenic edema. No hemorrhage, hydrocephalus or extra-axial collection. Single punctate focus of hemosiderin deposition noted in the left frontal white matter, not significant as an isolated finding. Vascular: Major vessels at the base of the brain show flow. Skull and upper cervical spine: Negative Sinuses/Orbits: Clear/normal Other: None IMPRESSION: No acute or reversible finding. Mild age related volume loss. Minimal chronic small-vessel change of the hemispheric white matter. Single punctate focus of hemosiderin deposition in the left frontal white matter, not significant as an isolated finding. No sign of metastatic melanoma. Electronically Signed   By: Nelson Chimes M.D.   On: 07/27/2022 13:26   MR LUMBAR SPINE WO CONTRAST  Result Date: 07/23/2022 CLINICAL DATA:  Low back  pain, leg weakness EXAM: MRI LUMBAR SPINE WITHOUT CONTRAST TECHNIQUE: Multiplanar, multisequence MR imaging of the lumbar spine was performed. No intravenous contrast was administered. COMPARISON:  None Available. FINDINGS: Segmentation:  Standard. Alignment:  Mild dextrocurvature.  No listhesis. Vertebrae:  No acute fracture or suspicious osseous lesion. Conus medullaris and cauda equina: Conus extends to the T12-L1 level. Conus and cauda equina appear normal. Paraspinal and other soft tissues: Negative. Disc levels: T12-L1: No significant disc bulge. No spinal canal stenosis or neural foraminal narrowing. L1-L2: Disc desiccation and minimal disc bulge. Mild facet arthropathy. No spinal canal stenosis or neural foraminal narrowing. L2-L3: No significant disc bulge. Mild facet arthropathy. No spinal canal stenosis or neural foraminal narrowing. L3-L4: Disc desiccation and minimal disc bulge. Mild facet arthropathy. No spinal canal stenosis or neural foraminal narrowing. L4-L5: No significant disc bulge. Mild facet arthropathy. No spinal canal stenosis or neural foraminal narrowing. L5-S1: Disc desiccation and small central disc protrusion. Mild-to-moderate facet arthropathy. No spinal canal stenosis or neural foraminal narrowing. IMPRESSION: 1. No spinal canal stenosis or neural foraminal narrowing. 2. Multilevel facet arthropathy, which can be a cause of back pain. Electronically Signed   By: Merilyn Baba M.D.   On: 07/23/2022 23:33   CT HEAD WO CONTRAST (5MM)  Result Date: 07/23/2022 CLINICAL DATA:  Numbness in legs and left-sided facial. EXAM: CT HEAD WITHOUT CONTRAST TECHNIQUE: Contiguous axial images were obtained from the base of the skull through the vertex without intravenous contrast. RADIATION DOSE REDUCTION: This exam was performed according to the departmental dose-optimization program which includes automated exposure control, adjustment of the mA and/or kV according to patient size and/or use of  iterative reconstruction technique. COMPARISON:  None Available. FINDINGS: Brain: The ventricles are in the midline without mass effect or shift. They are normal in size and configuration. Mild age related cerebral atrophy but no significant periventricular white matter changes. No CT findings for acute hemispheric infarction or intracranial hemorrhage. No mass lesions. The brainstem and cerebellum unremarkable. Vascular: Vascular calcifications but no aneurysm or hyperdense vessels. Skull: No skull fracture or bone lesions. Sinuses/Orbits: The paranasal sinuses and mastoid air cells are clear. The globes are intact. Other: No scalp lesions or scalp hematoma. IMPRESSION: Mild age related cerebral atrophy but no acute intracranial findings or mass lesions. Electronically Signed   By: Marijo Sanes M.D.   On: 07/23/2022 12:41    Microbiology: Results for orders placed or performed during the hospital encounter of 07/23/22  Resp Panel by RT-PCR (Flu A&B, Covid) Anterior Nasal Swab     Status: None   Collection Time: 07/23/22 12:06 PM   Specimen: Anterior Nasal Swab  Result Value Ref Range Status   SARS Coronavirus 2 by RT PCR NEGATIVE NEGATIVE Final    Comment: (NOTE) SARS-CoV-2 target nucleic acids are NOT DETECTED.  The SARS-CoV-2 RNA is generally detectable in upper respiratory specimens during the acute phase of infection. The lowest concentration of SARS-CoV-2 viral copies this assay can detect is 138 copies/mL. A negative result does not preclude SARS-Cov-2 infection and should not be used as the sole basis for treatment or other patient management decisions. A negative result may occur with  improper specimen collection/handling, submission of specimen other than nasopharyngeal swab, presence of viral mutation(s) within the areas targeted by this assay, and inadequate number of viral copies(<138 copies/mL). A negative result must be combined with clinical observations, patient history, and  epidemiological information. The expected result is Negative.  Fact Sheet for Patients:  EntrepreneurPulse.com.au  Fact Sheet for Healthcare Providers:  IncredibleEmployment.be  This test is no t yet approved or cleared by the [place] FDA and  has been authorized for detection and/or diagnosis of SARS-CoV-2 by FDA under an Emergency Use Authorization (EUA). This EUA will remain  in effect (meaning this test can be used) for the duration of the COVID-19 declaration under Section 564(b)(1) of the Act, 21 U.S.C.section 360bbb-3(b)(1), unless the authorization is terminated  or revoked sooner.       Influenza A by PCR NEGATIVE NEGATIVE Final   Influenza B by PCR NEGATIVE NEGATIVE Final    Comment: (NOTE) The Xpert Xpress SARS-CoV-2/FLU/RSV plus assay is intended as an aid in the diagnosis of influenza from Nasopharyngeal swab specimens and should not be used as a sole basis for treatment. Nasal washings and aspirates are unacceptable for Xpert Xpress SARS-CoV-2/FLU/RSV testing.  Fact Sheet for Patients: EntrepreneurPulse.com.au  Fact Sheet for Healthcare Providers: IncredibleEmployment.be  This test is not yet approved or cleared by the [place] FDA and has been authorized for detection and/or diagnosis of SARS-CoV-2 by FDA under an Emergency Use Authorization (EUA). This EUA will remain in effect (meaning this test can be used) for the duration of the COVID-19 declaration under Section 564(b)(1) of the Act,  21 U.S.C. section 360bbb-3(b)(1), unless the authorization is terminated or revoked.  Performed at [facility], [address] [place], [place] [address]   CSF culture w Gram Stain     Status: None (Preliminary result)   Collection Time: 07/28/22  1:30 PM   Specimen: CSF; Cerebrospinal Fluid  Result Value Ref Range Status   Specimen Description   Final    CSF Performed at  [facility], [address], [place], [place] [address]    Special Requests   Final    NONE Performed at [facility], [address] [place], [place] [address]    Gram Stain   Final    NO ORGANISMS SEEN WBC SEEN RED BLOOD CELLS NONE SEEN Performed at [facility], [address], [place], [place] [address]    Culture   Final    NO GROWTH 3 DAYS Performed at [facility], [place] [address] [place], [place] [address]    Report Status PENDING  Incomplete    Labs: CBC: Recent Labs  Lab 07/26/22 0507 07/27/22 0641 07/28/22 0550 07/29/22 0358  WBC 5.5 5.5 5.7 5.9  HGB 12.5 13.1 13.1 13.1  HCT 37.5 40.0 40.1 40.6  MCV 83.1 86.0 85.1 84.8  PLT 301 281 326 037   Basic Metabolic Panel: Recent Labs  Lab 07/25/22 1354 07/25/22 1405 07/26/22 0507 07/27/22 0641 07/28/22 0550 07/29/22 0358 07/30/22 0335  NA  --    < > 140 136 135 134* 131*  K  --    < > 2.9* 3.5 4.0 4.6 4.5  CL  --    < > 108 105 105 107 101  CO2  --    < > 25 22 23 22  20*  GLUCOSE  --    < > 175* 171* 241* 208* 342*  BUN  --    < > 12 10 15 18  28*  CREATININE  --    < > 1.31* 1.08* 1.13* 0.96 1.13*  CALCIUM  --    < > 9.1 9.2 9.1 9.3 9.3  MG 2.2  --  2.3  --   --   --   --    < > = values in this interval not displayed.   Liver Function Tests: Recent Labs  Lab 07/27/22 1033  ALBUMIN 4.3   CBG: Recent Labs  Lab 07/30/22 1618 07/30/22 1826  GLUCAP 462* 397*    Discharge time spent: greater than 30 minutes.  This record has been created using Systems analyst. Errors have been sought and corrected,but may not always be located. Such creation errors do not reflect on the standard of care.   Signed: Lorella Nimrod, MD [facility] 07/31/2022

## 2022-07-31 LAB — COPPER, SERUM: Copper: 116 ug/dL (ref 80–158)

## 2022-08-01 LAB — CSF CULTURE W GRAM STAIN
Culture: NO GROWTH
Gram Stain: NONE SEEN

## 2022-08-04 LAB — MISC LABCORP TEST (SEND OUT): Labcorp test code: 9985

## 2023-03-24 IMAGING — MG MM DIGITAL SCREENING BILAT W/ TOMO AND CAD
6 of 10 series · 6 of 30 positions shown · non-contrast
Comparison: Previous exam(s).

CLINICAL DATA: Screening.

EXAM:
DIGITAL SCREENING BILATERAL MAMMOGRAM WITH TOMOSYNTHESIS AND CAD
TECHNIQUE: Bilateral screening digital craniocaudal and mediolateral oblique
mammograms were obtained. Bilateral screening digital breast
tomosynthesis was performed. The images were evaluated with
computer-aided detection.

[L MLO synth-2D (1 of 2)]
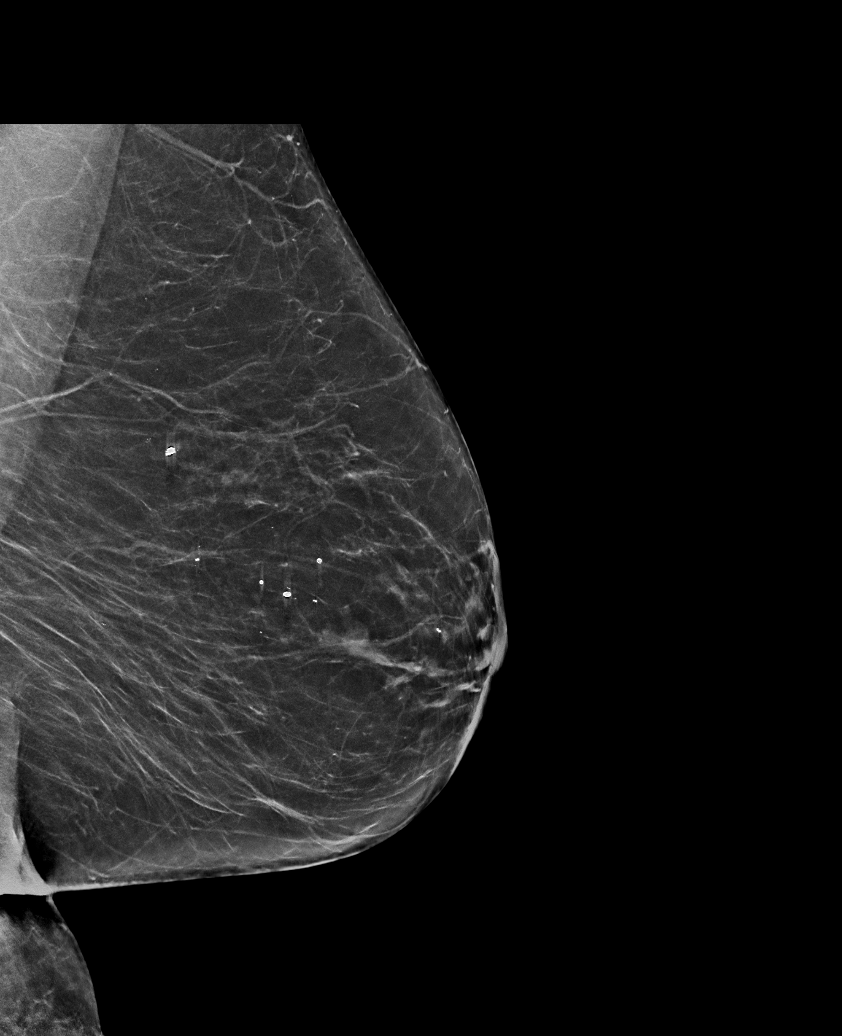

[L CC synth-2D]
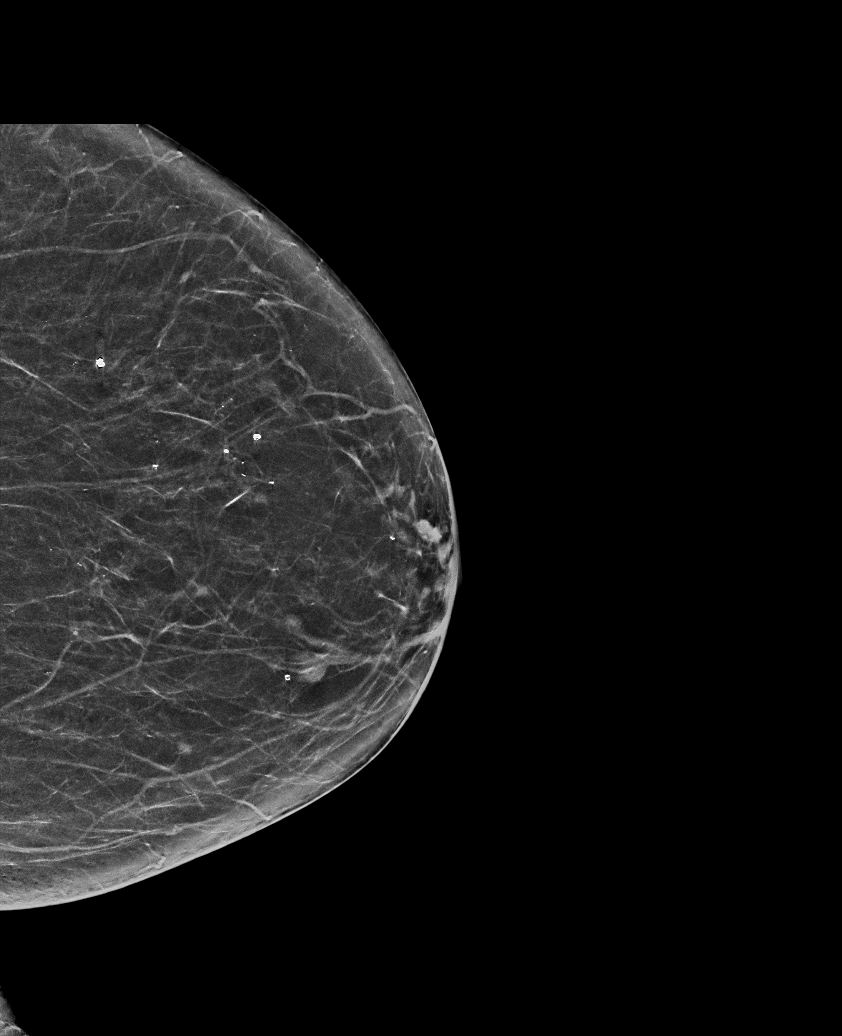

[R CC synth-2D]
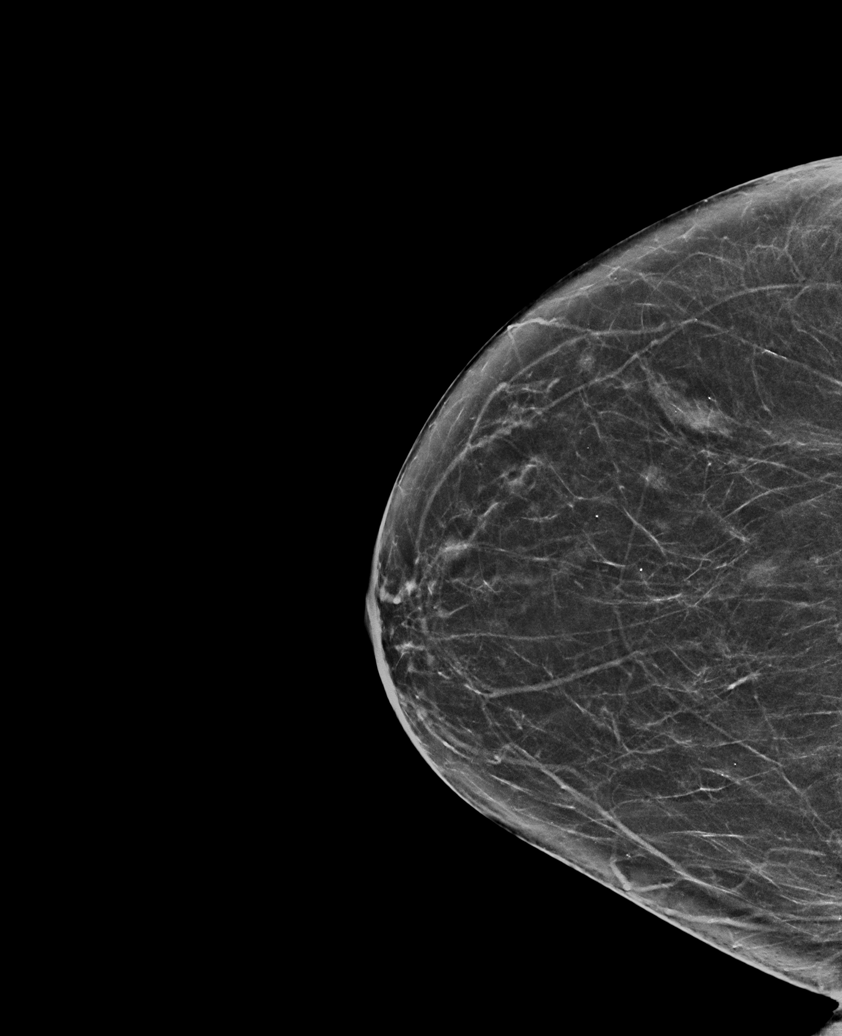

[L MLO synth-2D (2 of 2)]
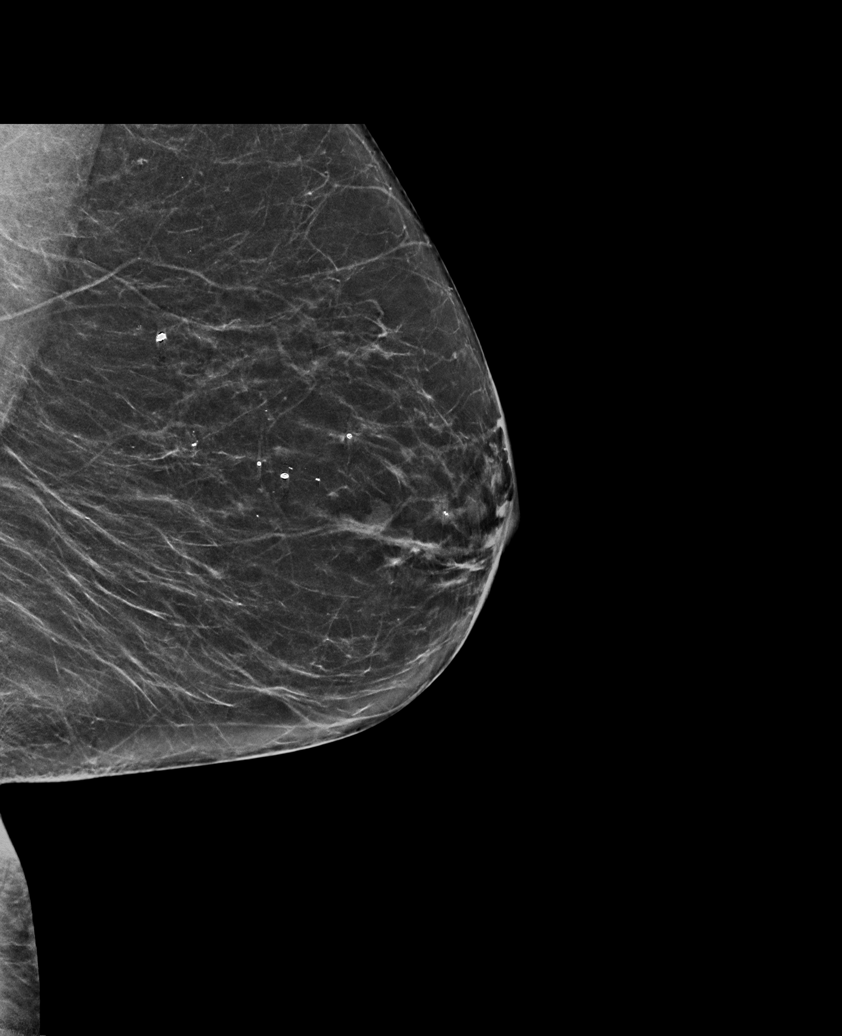

[R MLO synth-2D]
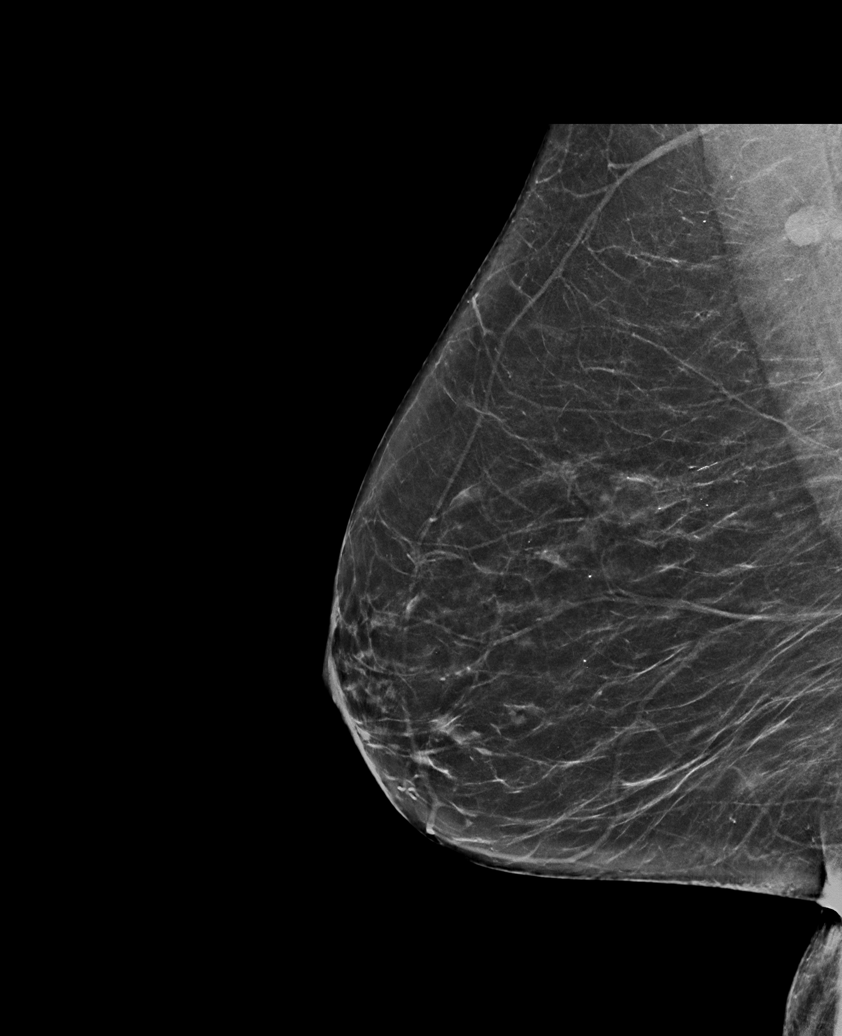

[L MLO tomo · tomo slice 33/65.0]
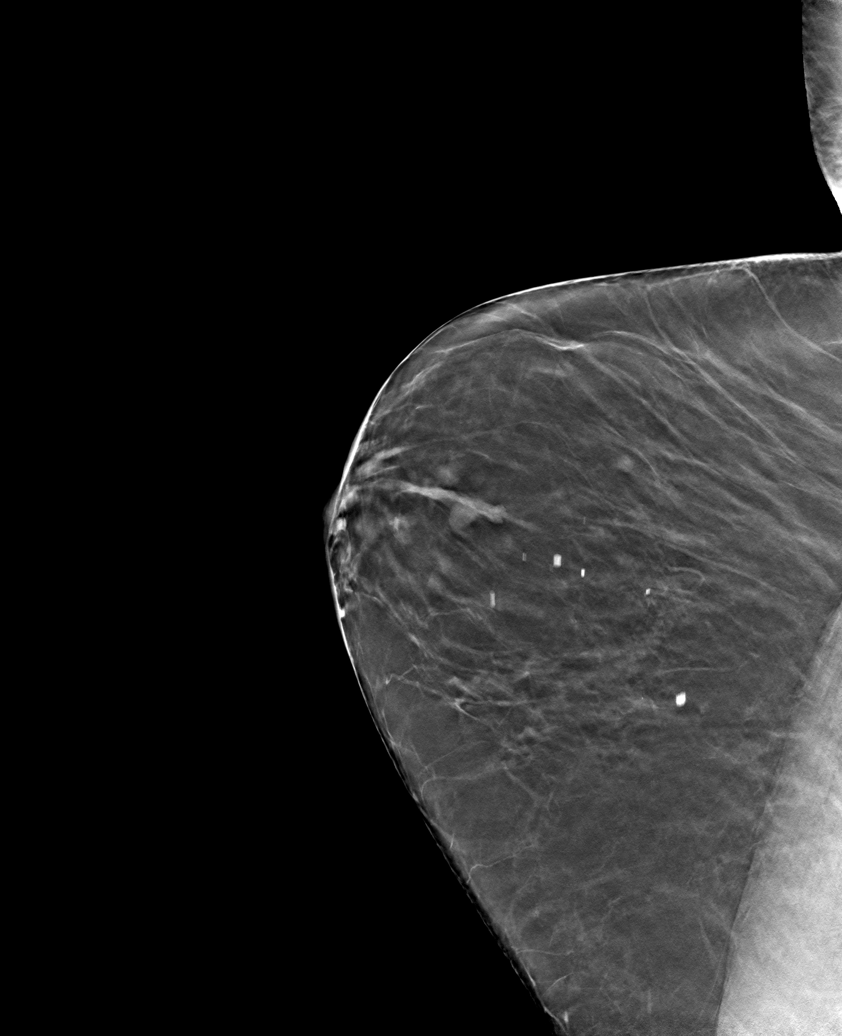

[6 of 30 positions shown; findings below may reference images not displayed]

ACR Breast Density Category b: There are scattered areas of
fibroglandular density.
FINDINGS: There are no findings suspicious for malignancy.
IMPRESSION: No mammographic evidence of malignancy. A result letter of this
screening mammogram will be mailed directly to the patient.

RECOMMENDATION:
Screening mammogram in one year. (Code:51-O-LD2)

BI-RADS CATEGORY  1: Negative.
